# Patient Record
Sex: Female | Born: 1980 | Hispanic: No | Marital: Married | Smoking: Never smoker
Health system: Southern US, Community
[De-identification: ages and names within clinical notes are randomized; demographics above are authoritative.]

## PROBLEM LIST (undated history)

## (undated) DIAGNOSIS — T7840XA Allergy, unspecified, initial encounter: Secondary | ICD-10-CM

## (undated) DIAGNOSIS — Z789 Other specified health status: Secondary | ICD-10-CM

## (undated) DIAGNOSIS — Z3490 Encounter for supervision of normal pregnancy, unspecified, unspecified trimester: Secondary | ICD-10-CM

## (undated) DIAGNOSIS — G919 Hydrocephalus, unspecified: Secondary | ICD-10-CM

## (undated) HISTORY — DX: Allergy, unspecified, initial encounter: T78.40XA

## (undated) HISTORY — PX: NO PAST SURGERIES: SHX2092

## (undated) HISTORY — PX: BRAIN SURGERY: SHX531

---

## 2005-01-07 ENCOUNTER — Other Ambulatory Visit: Admission: RE | Admit: 2005-01-07 | Discharge: 2005-01-07 | Payer: Self-pay | Admitting: Obstetrics and Gynecology

## 2006-02-06 ENCOUNTER — Other Ambulatory Visit: Admission: RE | Admit: 2006-02-06 | Discharge: 2006-02-06 | Payer: Self-pay | Admitting: Obstetrics and Gynecology

## 2013-04-18 NOTE — L&D Delivery Note (Signed)
Delivery Note At 10:22 PM a viable female was delivered via Vaginal, Spontaneous Delivery (Presentation: ; Occiput Anterior).  APGAR: 8, 9; weight 5 lb 2 oz (2325 g).   Placenta status: Intact, Spontaneous Pathology.  Cord: 3 vessels with the following complications: None.  Cord pH: collected  Anesthesia: None  Episiotomy: None Lacerations: 1st degree;Vaginal Suture Repair: 3.0 vicryl Est. Blood Loss (mL): 300  Mom to postpartum.  Baby to Nursery.  Routine PP orders Breastfeeding Placenta to pathology  Abdifatah Colquhoun 11/10/2013, 10:59 PM

## 2013-04-23 LAB — OB RESULTS CONSOLE HIV ANTIBODY (ROUTINE TESTING): HIV: NONREACTIVE

## 2013-04-23 LAB — OB RESULTS CONSOLE HEPATITIS B SURFACE ANTIGEN: HEP B S AG: NEGATIVE

## 2013-04-23 LAB — OB RESULTS CONSOLE ANTIBODY SCREEN: ANTIBODY SCREEN: NEGATIVE

## 2013-04-23 LAB — OB RESULTS CONSOLE GC/CHLAMYDIA
Chlamydia: NEGATIVE
Gonorrhea: NEGATIVE

## 2013-04-23 LAB — OB RESULTS CONSOLE ABO/RH: RH Type: POSITIVE

## 2013-04-23 LAB — OB RESULTS CONSOLE RUBELLA ANTIBODY, IGM: Rubella: IMMUNE

## 2013-05-27 ENCOUNTER — Emergency Department (HOSPITAL_BASED_OUTPATIENT_CLINIC_OR_DEPARTMENT_OTHER): Payer: Medicaid Other

## 2013-05-27 ENCOUNTER — Emergency Department (HOSPITAL_BASED_OUTPATIENT_CLINIC_OR_DEPARTMENT_OTHER)
Admission: EM | Admit: 2013-05-27 | Discharge: 2013-05-27 | Disposition: A | Discharge status: Home / Self Care | Payer: Medicaid Other | Attending: Emergency Medicine | Admitting: Emergency Medicine

## 2013-05-27 ENCOUNTER — Encounter (HOSPITAL_BASED_OUTPATIENT_CLINIC_OR_DEPARTMENT_OTHER): Payer: Self-pay | Admitting: Emergency Medicine

## 2013-05-27 DIAGNOSIS — M549 Dorsalgia, unspecified: Secondary | ICD-10-CM | POA: Insufficient documentation

## 2013-05-27 DIAGNOSIS — N9489 Other specified conditions associated with female genital organs and menstrual cycle: Secondary | ICD-10-CM | POA: Insufficient documentation

## 2013-05-27 DIAGNOSIS — R109 Unspecified abdominal pain: Secondary | ICD-10-CM | POA: Insufficient documentation

## 2013-05-27 DIAGNOSIS — Z3490 Encounter for supervision of normal pregnancy, unspecified, unspecified trimester: Secondary | ICD-10-CM

## 2013-05-27 DIAGNOSIS — O9989 Other specified diseases and conditions complicating pregnancy, childbirth and the puerperium: Secondary | ICD-10-CM | POA: Insufficient documentation

## 2013-05-27 HISTORY — DX: Encounter for supervision of normal pregnancy, unspecified, unspecified trimester: Z34.90

## 2013-05-27 LAB — WET PREP, GENITAL
Trich, Wet Prep: NONE SEEN
Yeast Wet Prep HPF POC: NONE SEEN

## 2013-05-27 LAB — URINALYSIS, ROUTINE W REFLEX MICROSCOPIC
Bilirubin Urine: NEGATIVE
GLUCOSE, UA: NEGATIVE mg/dL
Hgb urine dipstick: NEGATIVE
Ketones, ur: NEGATIVE mg/dL
LEUKOCYTES UA: NEGATIVE
NITRITE: NEGATIVE
PROTEIN: NEGATIVE mg/dL
Specific Gravity, Urine: 1.005 (ref 1.005–1.030)
Urobilinogen, UA: 0.2 mg/dL (ref 0.0–1.0)
pH: 7.5 (ref 5.0–8.0)

## 2013-05-27 MED ORDER — SODIUM CHLORIDE 0.9 % IV BOLUS (SEPSIS)
1000.0000 mL | Freq: Once | INTRAVENOUS | Status: AC
  Administered 2013-05-27: 1000 mL via INTRAVENOUS
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
## 2013-05-27 NOTE — Discharge Instructions (Signed)
Abdominal Pain During Pregnancy °Abdominal pain is common in pregnancy. Most of the time, it does not cause harm. There are many causes of abdominal pain. Some causes are more serious than others. Some of the causes of abdominal pain in pregnancy are easily diagnosed. Occasionally, the diagnosis takes time to understand. Other times, the cause is not determined. Abdominal pain can be a sign that something is very wrong with the pregnancy, or the pain may have nothing to do with the pregnancy at all. For this reason, always tell your health care provider if you have any abdominal discomfort. °HOME CARE INSTRUCTIONS  °Monitor your abdominal pain for any changes. The following actions may help to alleviate any discomfort you are experiencing: °· Do not have sexual intercourse or put anything in your vagina until your symptoms go away completely. °· Get plenty of rest until your pain improves. °· Drink clear fluids if you feel nauseous. Avoid solid food as long as you are uncomfortable or nauseous. °· Only take over-the-counter or prescription medicine as directed by your health care provider. °· Keep all follow-up appointments with your health care provider. °SEEK IMMEDIATE MEDICAL CARE IF: °· You are bleeding, leaking fluid, or passing tissue from the vagina. °· You have increasing pain or cramping. °· You have persistent vomiting. °· You have painful or bloody urination. °· You have a fever. °· You notice a decrease in your baby's movements. °· You have extreme weakness or feel faint. °· You have shortness of breath, with or without abdominal pain. °· You develop a severe headache with abdominal pain. °· You have abnormal vaginal discharge with abdominal pain. °· You have persistent diarrhea. °· You have abdominal pain that continues even after rest, or gets worse. °MAKE SURE YOU:  °· Understand these instructions. °· Will watch your condition. °· Will get help right away if you are not doing well or get  worse. °Document Released: 04/04/2005 Document Revised: 01/23/2013 Document Reviewed: 11/01/2012 °[employer]® Patient Information ©2014 [employer]. ° °
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
## 2013-05-27 NOTE — ED Notes (Signed)
Pelvic cart to bedside 

## 2013-05-28 LAB — GC/CHLAMYDIA PROBE AMP
CT PROBE, AMP APTIMA: NEGATIVE
GC Probe RNA: NEGATIVE

## 2013-11-10 ENCOUNTER — Inpatient Hospital Stay (HOSPITAL_COMMUNITY)
Admission: AD | Admit: 2013-11-10 | Discharge: 2013-11-12 | DRG: 775 | Disposition: A | Discharge status: Home / Self Care | Payer: Medicaid Other | Source: Ambulatory Visit | Attending: Obstetrics & Gynecology | Admitting: Obstetrics & Gynecology

## 2013-11-10 ENCOUNTER — Encounter (HOSPITAL_COMMUNITY): Payer: Self-pay | Admitting: *Deleted

## 2013-11-10 DIAGNOSIS — O99892 Other specified diseases and conditions complicating childbirth: Secondary | ICD-10-CM | POA: Diagnosis present

## 2013-11-10 DIAGNOSIS — O9989 Other specified diseases and conditions complicating pregnancy, childbirth and the puerperium: Secondary | ICD-10-CM

## 2013-11-10 DIAGNOSIS — O42019 Preterm premature rupture of membranes, onset of labor within 24 hours of rupture, unspecified trimester: Secondary | ICD-10-CM | POA: Diagnosis present

## 2013-11-10 DIAGNOSIS — O923 Agalactia: Secondary | ICD-10-CM | POA: Diagnosis present

## 2013-11-10 DIAGNOSIS — O4290 Premature rupture of membranes, unspecified as to length of time between rupture and onset of labor, unspecified weeks of gestation: Principal | ICD-10-CM | POA: Diagnosis present

## 2013-11-10 DIAGNOSIS — Z22330 Carrier of Group B streptococcus: Secondary | ICD-10-CM

## 2013-11-10 HISTORY — DX: Other specified health status: Z78.9

## 2013-11-10 LAB — CBC
HEMATOCRIT: 34.6 % — AB (ref 36.0–46.0)
HEMOGLOBIN: 11.4 g/dL — AB (ref 12.0–15.0)
MCH: 28.3 pg (ref 26.0–34.0)
MCHC: 32.9 g/dL (ref 30.0–36.0)
MCV: 85.9 fL (ref 78.0–100.0)
Platelets: 171 10*3/uL (ref 150–400)
RBC: 4.03 MIL/uL (ref 3.87–5.11)
RDW: 12.8 % (ref 11.5–15.5)
WBC: 6.6 10*3/uL (ref 4.0–10.5)

## 2013-11-10 MED ORDER — NALBUPHINE HCL 10 MG/ML IJ SOLN: 10.0000 mg | INTRAMUSCULAR | Status: DC | PRN

## 2013-11-10 MED ORDER — CITRIC ACID-SODIUM CITRATE 334-500 MG/5ML PO SOLN: 30.0000 mL | ORAL | Status: DC | PRN

## 2013-11-10 MED ORDER — LACTATED RINGERS IV SOLN: INTRAVENOUS | Status: DC

## 2013-11-10 MED ORDER — DEXTROSE 5 % IV SOLN
5.0000 10*6.[IU] | Freq: Once | INTRAVENOUS | Status: AC
  Administered 2013-11-10: 5 10*6.[IU] via INTRAVENOUS
  Filled 2013-11-10: qty 5

## 2013-11-10 MED ORDER — OXYCODONE-ACETAMINOPHEN 5-325 MG PO TABS: 1.0000 | ORAL_TABLET | ORAL | Status: DC | PRN

## 2013-11-10 MED ORDER — IBUPROFEN 600 MG PO TABS
600.0000 mg | ORAL_TABLET | Freq: Four times a day (QID) | ORAL | Status: DC | PRN
  Filled 2013-11-10: qty 1

## 2013-11-10 MED ORDER — LIDOCAINE HCL (PF) 1 % IJ SOLN
30.0000 mL | INTRAMUSCULAR | Status: DC | PRN
  Administered 2013-11-10: 30 mL via SUBCUTANEOUS
  Filled 2013-11-10: qty 30

## 2013-11-10 MED ORDER — LACTATED RINGERS IV SOLN
INTRAVENOUS | Status: DC
  Administered 2013-11-10: 22:00:00 via INTRAUTERINE

## 2013-11-10 MED ORDER — PENICILLIN G POTASSIUM 5000000 UNITS IJ SOLR
2.5000 10*6.[IU] | INTRAMUSCULAR | Status: DC
  Administered 2013-11-10: 2.5 10*6.[IU] via INTRAVENOUS
  Filled 2013-11-10 (×2): qty 2.5

## 2013-11-10 MED ORDER — LACTATED RINGERS IV SOLN: 500.0000 mL | INTRAVENOUS | Status: DC | PRN

## 2013-11-10 MED ORDER — OXYTOCIN BOLUS FROM INFUSION
500.0000 mL | INTRAVENOUS | Status: DC
  Administered 2013-11-10: 500 mL via INTRAVENOUS

## 2013-11-10 MED ORDER — ONDANSETRON HCL 4 MG/2ML IJ SOLN: 4.0000 mg | Freq: Four times a day (QID) | INTRAMUSCULAR | Status: DC | PRN

## 2013-11-10 MED ORDER — ACETAMINOPHEN 325 MG PO TABS
650.0000 mg | ORAL_TABLET | ORAL | Status: DC | PRN
  Administered 2013-11-10: 650 mg via ORAL
  Filled 2013-11-10: qty 2

## 2013-11-10 MED ORDER — OXYTOCIN 40 UNITS IN LACTATED RINGERS INFUSION - SIMPLE MED
62.5000 mL/h | INTRAVENOUS | Status: DC
  Filled 2013-11-10: qty 1000

## 2013-11-10 NOTE — Progress Notes (Signed)
Tamera StandsJessica Ehly CNM notified of pt's complaints and SROM, orders received

## 2013-11-10 NOTE — H&P (Signed)
Deborah MedalHumaira Hines is a 33 y.o. female, G3P2002 at 35.2 weeks, presenting for PPROM. Patient states that she has not been experiencing contractions or VB prior to incident.  Patient reports active fetus and states gush of fluid occurred around 1700.  Contractions noted on toco and vertex position confirmed by ultrasound.    There are no active problems to display for this patient.   History of present pregnancy: Patient entered care at 12.4 weeks.   EDC of 12/13/2013 was established by Definite LMP of 03/08/2013 & 6wk US on 04/19/2013.   Anatomy scan:  18.4 weeks, with normal findings and an posterior placenta.   Additional US evaluations:  None.   Significant prenatal events:  Common pregnancy discomforts including sciatica pain, vaginal pressure, and braxton hicks contractions   Last evaluation:  11/06/2013 at 34.5wks by Dr. Carmela HurtE. Kulwa  OB History   Grav Para Term Preterm Abortions TAB SAB Ect Mult Living   3 2        2      Past Medical History  Diagnosis Date  . Pregnant    No past surgical history on file. Family History: family history is not on file. Social History:  reports that she has never smoked. She does not have any smokeless tobacco history on file. She reports that she does not drink alcohol. Her drug history is not on file.   Prenatal Transfer Tool  Maternal Diabetes: No Genetic Screening: Declined Maternal Ultrasounds/Referrals: Normal Fetal Ultrasounds or other Referrals:  None Maternal Substance Abuse:  No Significant Maternal Medications:  None Significant Maternal Lab Results: Lab values include: Other: GBS Unknown    ROS:  See HPI  No Known Allergies     Last menstrual period 03/08/2013.  Chest clear Heart RRR without murmur Abd gravid, NT Pelvic: Ft/50/-4 Ext: WNL  FHR: 130 bpm, Mod Var, -Decels, +Accels UCs:  Graphed Q 4-6 mins, palpates moderate  Prenatal labs: ABO, Rh:  B Positive Antibody:  Negative Rubella:   Immune RPR:   Non  Reactive HBsAg:   Negative HIV:   Negative GBS:  Unknown Sickle cell/Hgb electrophoresis:  Normal Pap:  Normal 06/04/13 GC:  Negative Chlamydia:  Negative Genetic screenings:  Declined Glucola:  Normal 3hr Other:  None    Assessment IUP at 35.2wks PPROM Cat I FT GBS Unknown  [facility]: [facility] consult with Dr. Katharine LookJ. Ozan Routine Labor and Delivery Orders Start PCN for GBS prophylaxis NICU consult to address patient and husbands concerns regarding preterm infant Okay for Epidural, when patient requests Anticipate SVD  Phillips ClimesMLY, Malita Ignasiak LYNNCNM, MSN 11/10/2013, 5:49 PM

## 2013-11-10 NOTE — MAU Note (Cosign Needed)
Pt presents to MAU with complaints for ROM at 1700 today clear fluid. Denies any vaginal bleeding

## 2013-11-11 LAB — CBC
HEMATOCRIT: 33.6 % — AB (ref 36.0–46.0)
Hemoglobin: 11.1 g/dL — ABNORMAL LOW (ref 12.0–15.0)
MCH: 28 pg (ref 26.0–34.0)
MCHC: 33 g/dL (ref 30.0–36.0)
MCV: 84.8 fL (ref 78.0–100.0)
Platelets: 157 10*3/uL (ref 150–400)
RBC: 3.96 MIL/uL (ref 3.87–5.11)
RDW: 12.7 % (ref 11.5–15.5)
WBC: 9.2 10*3/uL (ref 4.0–10.5)

## 2013-11-11 LAB — RPR

## 2013-11-11 MED ORDER — ONDANSETRON HCL 4 MG/2ML IJ SOLN: 4.0000 mg | INTRAMUSCULAR | Status: DC | PRN

## 2013-11-11 MED ORDER — ACETAMINOPHEN 325 MG PO TABS
650.0000 mg | ORAL_TABLET | ORAL | Status: DC | PRN
  Administered 2013-11-11 – 2013-11-12 (×4): 650 mg via ORAL
  Filled 2013-11-11 (×4): qty 2

## 2013-11-11 MED ORDER — ZOLPIDEM TARTRATE 5 MG PO TABS: 5.0000 mg | ORAL_TABLET | Freq: Every evening | ORAL | Status: DC | PRN

## 2013-11-11 MED ORDER — SIMETHICONE 80 MG PO CHEW: 80.0000 mg | CHEWABLE_TABLET | ORAL | Status: DC | PRN

## 2013-11-11 MED ORDER — WITCH HAZEL-GLYCERIN EX PADS: 1.0000 "application " | MEDICATED_PAD | CUTANEOUS | Status: DC | PRN

## 2013-11-11 MED ORDER — BENZOCAINE-MENTHOL 20-0.5 % EX AERO
1.0000 "application " | INHALATION_SPRAY | CUTANEOUS | Status: DC | PRN
  Administered 2013-11-12: 1 via TOPICAL
  Filled 2013-11-11 (×2): qty 56

## 2013-11-11 MED ORDER — SENNOSIDES-DOCUSATE SODIUM 8.6-50 MG PO TABS: 2.0000 | ORAL_TABLET | ORAL | Status: DC

## 2013-11-11 MED ORDER — DIBUCAINE 1 % RE OINT: 1.0000 "application " | TOPICAL_OINTMENT | RECTAL | Status: DC | PRN

## 2013-11-11 MED ORDER — LANOLIN HYDROUS EX OINT: TOPICAL_OINTMENT | CUTANEOUS | Status: DC | PRN

## 2013-11-11 MED ORDER — DIPHENHYDRAMINE HCL 25 MG PO CAPS: 25.0000 mg | ORAL_CAPSULE | Freq: Four times a day (QID) | ORAL | Status: DC | PRN

## 2013-11-11 MED ORDER — PRENATAL MULTIVITAMIN CH
1.0000 | ORAL_TABLET | Freq: Every day | ORAL | Status: DC
  Administered 2013-11-11: 1 via ORAL
  Filled 2013-11-11: qty 1

## 2013-11-11 MED ORDER — OXYCODONE-ACETAMINOPHEN 10-325 MG PO TABS: 1.0000 | ORAL_TABLET | ORAL | Status: DC | PRN

## 2013-11-11 MED ORDER — TETANUS-DIPHTH-ACELL PERTUSSIS 5-2.5-18.5 LF-MCG/0.5 IM SUSP: 0.5000 mL | Freq: Once | INTRAMUSCULAR | Status: DC

## 2013-11-11 MED ORDER — ONDANSETRON HCL 4 MG PO TABS: 4.0000 mg | ORAL_TABLET | ORAL | Status: DC | PRN

## 2013-11-11 NOTE — Lactation Note (Signed)
This note was copied from the chart of Deborah Nattie Mcpeters. Lactation Consultation Note  Patient Name: Deborah Hines ZOXWR'UToday's Date: 11/11/2013 Reason for consult: Other (Comment);NICU baby;Infant < 6lbs;Late preterm infant (mom asleep and has not yet received initial assessment) RN, ChyrArn Medall CivatteJoAnn [facility] is asleep right now but has initiated use of DEBP since her baby was transferred to NICU.  Previous LC had attempted a visit earlier and [facility] brochure at bedside but this mom [facility] as well as NICU booklet for breastfeeding moms.     Maternal Data Formula Feeding for Exclusion: Yes Reason for exclusion: Admission to Intensive Care Unit (ICU) post-partum (baby transferred to NICU this evening and mom pumping) Infant to breast within first hour of birth: Yes (unable to latch) Has patient been taught Hand Expression?: No Does the patient have breastfeeding experience prior to this delivery?: Yes  Feeding Feeding Type: Bottle Fed - Formula Nipple Type: Slow - flow Length of feed: 10 min  LATCH Score/Interventions            N/A - baby transferred to NICU          Lactation Tools Discussed/Used Initiated by:: RN, Chyrl CivatteJoAnn [facility] ([facility] asleep and LC deferred visit until tomorrow) Date initiated:: 11/11/13 DEBP use started by RN  Consult Status Consult Status: Follow-up Date: 11/12/13 Follow-up type: In-patient    [name], Haim Hansson [facility] 11/11/2013, 10:49 PM

## 2013-11-11 NOTE — Progress Notes (Signed)
UR chart review completed.  

## 2013-11-11 NOTE — Discharge Instructions (Signed)
Postpartum Depression and Baby Blues °The postpartum period begins right after the birth of a baby. During this time, there is often a great amount of joy and excitement. It is also a time of many changes in the life of the parents. Regardless of how many times a mother gives birth, each child brings new challenges and dynamics to the family. It is not unusual to have feelings of excitement along with confusing shifts in moods, emotions, and thoughts. All mothers are at risk of developing postpartum depression or the "baby blues." These mood changes can occur right after giving birth, or they may occur many months after giving birth. The baby blues or postpartum depression can be mild or severe. Additionally, postpartum depression can go away rather quickly, or it can be a long-term condition.  °CAUSES °Raised hormone levels and the rapid drop in those levels are thought to be a main cause of postpartum depression and the baby blues. A number of hormones change during and after pregnancy. Estrogen and progesterone usually decrease right after the delivery of your baby. The levels of thyroid hormone and various cortisol steroids also rapidly drop. Other factors that play a role in these mood changes include major life events and genetics.  °RISK FACTORS °If you have any of the following risks for the baby blues or postpartum depression, know what symptoms to watch out for during the postpartum period. Risk factors that may increase the likelihood of getting the baby blues or postpartum depression include: °· Having a personal or family history of depression.   °· Having depression while being pregnant.   °· Having premenstrual mood issues or mood issues related to oral contraceptives. °· Having a lot of life stress.   °· Having marital conflict.   °· Lacking a social support network.   °· Having a baby with special needs.   °· Having health problems, such as diabetes.   °SIGNS AND SYMPTOMS °Symptoms of baby blues  include: °· Brief changes in mood, such as going from extreme happiness to sadness. °· Decreased concentration.   °· Difficulty sleeping.   °· Crying spells, tearfulness.   °· Irritability.   °· Anxiety.   °Symptoms of postpartum depression typically begin within the first month after giving birth. These symptoms include: °· Difficulty sleeping or excessive sleepiness.   °· Marked weight loss.   °· Agitation.   °· Feelings of worthlessness.   °· Lack of interest in activity or food.   °Postpartum psychosis is a very serious condition and can be dangerous. Fortunately, it is rare. Displaying any of the following symptoms is cause for immediate medical attention. Symptoms of postpartum psychosis include:  °· Hallucinations and delusions.   °· Bizarre or disorganized behavior.   °· Confusion or disorientation.   °DIAGNOSIS  °A diagnosis is made by an evaluation of your symptoms. There are no medical or lab tests that lead to a diagnosis, but there are various questionnaires that a health care provider may use to identify those with the baby blues, postpartum depression, or psychosis. Often, a screening tool called the Edinburgh Postnatal Depression Scale is used to diagnose depression in the postpartum period.  °TREATMENT °The baby blues usually goes away on its own in 1-2 weeks. Social support is often all that is needed. You will be encouraged to get adequate sleep and rest. Occasionally, you may be given medicines to help you sleep.  °Postpartum depression requires treatment because it can last several months or longer if it is not treated. Treatment may include individual or group therapy, medicine, or both to address any social, physiological, and psychological   factors that may play a role in the depression. Regular exercise, a healthy diet, rest, and social support may also be strongly recommended.  Postpartum psychosis is more serious and needs treatment right away. Hospitalization is often needed. HOME CARE  INSTRUCTIONS  Get as much rest as you can. Nap when the baby sleeps.   Exercise regularly. Some women find yoga and walking to be beneficial.   Eat a balanced and nourishing diet.   Do little things that you enjoy. Have a cup of tea, take a bubble bath, read your favorite magazine, or listen to your favorite music.  Avoid alcohol.   Ask for help with household chores, cooking, grocery shopping, or running errands as needed. Do not try to do everything.   Talk to people close to you about how you are feeling. Get support from your partner, family members, friends, or other new moms.  Try to stay positive in how you think. Think about the things you are grateful for.   Do not spend a lot of time alone.   Only take over-the-counter or prescription medicine as directed by your health care provider.  Keep all your postpartum appointments.   Let your health care provider know if you have any concerns.  SEEK MEDICAL CARE IF: You are having a reaction to or problems with your medicine. SEEK IMMEDIATE MEDICAL CARE IF:  You have suicidal feelings.   You think you may harm the baby or someone else. MAKE SURE YOU:  Understand these instructions.  Will watch your condition.  Will get help right away if you are not doing well or get worse. Document Released: 01/07/2004 Document Revised: 04/09/2013 Document Reviewed: 01/14/2013 [facility] Patient Information 2015 [place], [place]. This information is not intended to replace advice given to you by your health care provider. Make sure you discuss any questions you have with your health care provider. Breastfeeding Deciding to breastfeed is one of the best choices you can make for you and your baby. A change in hormones during pregnancy causes your breast tissue to grow and increases the number and size of your milk ducts. These hormones also allow proteins, sugars, and fats from your blood supply to make breast milk in your  milk-producing glands. Hormones prevent breast milk from being released before your baby is born as well as prompt milk flow after birth. Once breastfeeding has begun, thoughts of your baby, as well as his or her sucking or crying, can stimulate the release of milk from your milk-producing glands.  BENEFITS OF BREASTFEEDING For Your Baby  Your first milk (colostrum) helps your baby's digestive system function better.   There are antibodies in your milk that help your baby fight off infections.   Your baby has a lower incidence of asthma, allergies, and sudden infant death syndrome.   The nutrients in breast milk are better for your baby than infant formulas and are designed uniquely for your baby's needs.   Breast milk improves your baby's brain development.   Your baby is less likely to develop other conditions, such as childhood obesity, asthma, or type 2 diabetes mellitus.  For You   Breastfeeding helps to create a very special bond between you and your baby.   Breastfeeding is convenient. Breast milk is always available at the correct temperature and costs nothing.   Breastfeeding helps to burn calories and helps you lose the weight gained during pregnancy.   Breastfeeding makes your uterus contract to its prepregnancy size faster and slows bleeding (  lochia) after you give birth.   Breastfeeding helps to lower your risk of developing type 2 diabetes mellitus, osteoporosis, and breast or ovarian cancer later in life. SIGNS THAT YOUR BABY IS HUNGRY Early Signs of Hunger  Increased alertness or activity.  Stretching.  Movement of the head from side to side.  Movement of the head and opening of the mouth when the corner of the mouth or cheek is stroked (rooting).  Increased sucking sounds, smacking lips, cooing, sighing, or squeaking.  Hand-to-mouth movements.  Increased sucking of fingers or hands. Late Signs of Hunger  Fussing.  Intermittent crying. Extreme  Signs of Hunger Signs of extreme hunger will require calming and consoling before your baby will be able to breastfeed successfully. Do not wait for the following signs of extreme hunger to occur before you initiate breastfeeding:   Restlessness.  A loud, strong cry.   Screaming. BREASTFEEDING BASICS Breastfeeding Initiation  Find a comfortable place to sit or lie down, with your neck and back well supported.  Place a pillow or rolled up blanket under your baby to bring him or her to the level of your breast (if you are seated). Nursing pillows are specially designed to help support your arms and your baby while you breastfeed.  Make sure that your baby's abdomen is facing your abdomen.   Gently massage your breast. With your fingertips, massage from your chest wall toward your nipple in a circular motion. This encourages milk flow. You may need to continue this action during the feeding if your milk flows slowly.  Support your breast with 4 fingers underneath and your thumb above your nipple. Make sure your fingers are well away from your nipple and your baby's mouth.   Stroke your baby's lips gently with your finger or nipple.   When your baby's mouth is open wide enough, quickly bring your baby to your breast, placing your entire nipple and as much of the colored area around your nipple (areola) as possible into your baby's mouth.   More areola should be visible above your baby's upper lip than below the lower lip.   Your baby's tongue should be between his or her lower gum and your breast.   Ensure that your baby's mouth is correctly positioned around your nipple (latched). Your baby's lips should create a seal on your breast and be turned out (everted).  It is common for your baby to suck about 2-3 minutes in order to start the flow of breast milk. Latching Teaching your baby how to latch on to your breast properly is very important. An improper latch can cause nipple  pain and decreased milk supply for you and poor weight gain in your baby. Also, if your baby is not latched onto your nipple properly, he or she may swallow some air during feeding. This can make your baby fussy. Burping your baby when you switch breasts during the feeding can help to get rid of the air. However, teaching your baby to latch on properly is still the best way to prevent fussiness from swallowing air while breastfeeding. Signs that your baby has successfully latched on to your nipple:    Silent tugging or silent sucking, without causing you pain.   Swallowing heard between every 3-4 sucks.    Muscle movement above and in front of his or her ears while sucking.  Signs that your baby has not successfully latched on to nipple:   Sucking sounds or smacking sounds from your baby while  breastfeeding.  Nipple pain. If you think your baby has not latched on correctly, slip your finger into the corner of your baby's mouth to break the suction and place it between your baby's gums. Attempt breastfeeding initiation again. Signs of Successful Breastfeeding Signs from your baby:   A gradual decrease in the number of sucks or complete cessation of sucking.   Falling asleep.   Relaxation of his or her body.   Retention of a small amount of milk in his or her mouth.   Letting go of your breast by himself or herself. Signs from you:  Breasts that have increased in firmness, weight, and size 1-3 hours after feeding.   Breasts that are softer immediately after breastfeeding.  Increased milk volume, as well as a change in milk consistency and color by the fifth day of breastfeeding.   Nipples that are not sore, cracked, or bleeding. Signs That Your [employer] is Getting Enough Milk  Wetting at least 3 diapers in a 24-hour period. The urine should be clear and pale yellow by age 69 days.  At least 3 stools in a 24-hour period by age 69 days. The stool should be soft and  yellow.  At least 3 stools in a 24-hour period by age 70 days. The stool should be seedy and yellow.  No loss of weight greater than 10% of birth weight during the first 66 days of age.  Average weight gain of 4-7 ounces (113-198 g) per week after age 11 days.  Consistent daily weight gain by age 36 days, without weight loss after the age of 2 weeks. After a feeding, your baby may spit up a small amount. This is common. BREASTFEEDING FREQUENCY AND DURATION Frequent feeding will help you make more milk and can prevent sore nipples and breast engorgement. Breastfeed when you feel the need to reduce the fullness of your breasts or when your baby shows signs of hunger. This is called "breastfeeding on demand." Avoid introducing a pacifier to your baby while you are working to establish breastfeeding (the first 4-6 weeks after your baby is born). After this time you may choose to use a pacifier. Research has shown that pacifier use during the first year of a baby's life decreases the risk of sudden infant death syndrome (SIDS). Allow your baby to feed on each breast as long as he or she wants. Breastfeed until your baby is finished feeding. When your baby unlatches or falls asleep while feeding from the first breast, offer the second breast. Because newborns are often sleepy in the first few weeks of life, you may need to awaken your baby to get him or her to feed. Breastfeeding times will vary from baby to baby. However, the following rules can serve as a guide to help you ensure that your baby is properly fed:  Newborns (babies 12 weeks of age or younger) may breastfeed every 1-3 hours.  Newborns should not go longer than 3 hours during the day or 5 hours during the night without breastfeeding.  You should breastfeed your baby a minimum of 8 times in a 24-hour period until you begin to introduce solid foods to your baby at around 28 months of age. BREAST MILK PUMPING Pumping and storing breast milk  allows you to ensure that your baby is exclusively fed your breast milk, even at times when you are unable to breastfeed. This is especially important if you are going back to work while you are still breastfeeding or when  you are not able to be present during feedings. Your lactation consultant can give you guidelines on how long it is safe to store breast milk.  A breast pump is a machine that allows you to pump milk from your breast into a sterile bottle. The pumped breast milk can then be stored in a refrigerator or freezer. Some breast pumps are operated by hand, while others use electricity. Ask your lactation consultant which type will work best for you. Breast pumps can be purchased, but some hospitals and breastfeeding support groups lease breast pumps on a monthly basis. A lactation consultant can teach you how to hand express breast milk, if you prefer not to use a pump.  CARING FOR YOUR BREASTS WHILE YOU BREASTFEED Nipples can become dry, cracked, and sore while breastfeeding. The following recommendations can help keep your breasts moisturized and healthy:  Avoid using soap on your nipples.   Wear a supportive bra. Although not required, special nursing bras and tank tops are designed to allow access to your breasts for breastfeeding without taking off your entire bra or top. Avoid wearing underwire-style bras or extremely tight bras.  Air dry your nipples for 3-8minutes after each feeding.   Use only cotton bra pads to absorb leaked breast milk. Leaking of breast milk between feedings is normal.   Use lanolin on your nipples after breastfeeding. Lanolin helps to maintain your skin's normal moisture barrier. If you use pure lanolin, you do not need to wash it off before feeding your baby again. Pure lanolin is not toxic to your baby. You may also hand express a few drops of breast milk and gently massage that milk into your nipples and allow the milk to air dry. In the first few weeks  after giving birth, some women experience extremely full breasts (engorgement). Engorgement can make your breasts feel heavy, warm, and tender to the touch. Engorgement peaks within 3-5 days after you give birth. The following recommendations can help ease engorgement:  Completely empty your breasts while breastfeeding or pumping. You may want to start by applying warm, moist heat (in the shower or with warm water-soaked hand towels) just before feeding or pumping. This increases circulation and helps the milk flow. If your baby does not completely empty your breasts while breastfeeding, pump any extra milk after he or she is finished.  Wear a snug bra (nursing or regular) or tank top for 1-2 days to signal your body to slightly decrease milk production.  Apply ice packs to your breasts, unless this is too uncomfortable for you.  Make sure that your baby is latched on and positioned properly while breastfeeding. If engorgement persists after 48 hours of following these recommendations, contact your health care provider or a Science writer. OVERALL HEALTH CARE RECOMMENDATIONS WHILE BREASTFEEDING  Eat healthy foods. Alternate between meals and snacks, eating 3 of each per day. Because what you eat affects your breast milk, some of the foods may make your baby more irritable than usual. Avoid eating these foods if you are sure that they are negatively affecting your baby.  Drink milk, fruit juice, and water to satisfy your thirst (about 10 glasses a day).   Rest often, relax, and continue to take your prenatal vitamins to prevent fatigue, stress, and anemia.  Continue breast self-awareness checks.  Avoid chewing and smoking tobacco.  Avoid alcohol and drug use. Some medicines that may be harmful to your baby can pass through breast milk. It is important to ask your  health care provider before taking any medicine, including all over-the-counter and prescription medicine as well as vitamin  and herbal supplements. It is possible to become pregnant while breastfeeding. If birth control is desired, ask your health care provider about options that will be safe for your baby. SEEK MEDICAL CARE IF:   You feel like you want to stop breastfeeding or have become frustrated with breastfeeding.  You have painful breasts or nipples.  Your nipples are cracked or bleeding.  Your breasts are red, tender, or warm.  You have a swollen area on either breast.  You have a fever or chills.  You have nausea or vomiting.  You have drainage other than breast milk from your nipples.  Your breasts do not become full before feedings by the fifth day after you give birth.  You feel sad and depressed.  Your baby is too sleepy to eat well.  Your baby is having trouble sleeping.   Your baby is wetting less than 3 diapers in a 24-hour period.  Your baby has less than 3 stools in a 24-hour period.  Your baby's skin or the white part of his or her eyes becomes yellow.   Your baby is not gaining weight by 275 days of age. SEEK IMMEDIATE MEDICAL CARE IF:   Your baby is overly tired (lethargic) and does not want to wake up and feed.  Your baby develops an unexplained fever. Document Released: 04/04/2005 Document Revised: 04/09/2013 Document Reviewed: 09/26/2012 [facility] Patient Information 2015 [employer], [place]. This information is not intended to replace advice given to you by your health care provider. Make sure you discuss any questions you have with your health care provider. Vaginal Delivery During delivery, your health care provider will help you give birth to your baby. During a vaginal delivery, you will work to push the baby out of your vagina. However, before you can push your baby out, a few things need to happen. The opening of your uterus (cervix) has to soften, thin out, and open up (dilate) all the way to 10 cm. Also, your baby has to move down from the uterus into your vagina.   SIGNS OF LABOR  Your health care provider will first need to make sure you are in labor. Signs of labor include:   Passing what is called the mucous plug before labor begins. This is a small amount of blood-stained mucus.  Having regular, painful uterine contractions.   The time between contractions gets shorter.   The discomfort and pain gradually get more intense.  Contraction pains get worse when walking and do not go away when resting.   Your cervix becomes thinner (effacement) and dilates. BEFORE THE DELIVERY Once you are in labor and admitted into the hospital or care center, your health care provider may do the following:   Perform a complete physical exam.  Review any complications related to pregnancy or labor.  Check your blood pressure, pulse, temperature, and heart rate (vital signs).   Determine if, and when, the rupture of amniotic membranes occurred.  Do a vaginal exam (using a sterile glove and lubricant) to determine:   The position (presentation) of the baby. Is the baby's head presenting first (vertex) in the birth canal (vagina), or are the feet or buttocks first (breech)?   The level (station) of the baby's head within the birth canal.   The effacement and dilatation of the cervix.   An electronic fetal monitor is usually placed on your abdomen when you  first arrive. This is used to monitor your contractions and the baby's heart rate.  When the monitor is on your abdomen (external fetal monitor), it can only pick up the frequency and length of your contractions. It cannot tell the strength of your contractions.  If it becomes necessary for your health care provider to know exactly how strong your contractions are or to see exactly what the baby's heart rate is doing, an internal monitor may be inserted into your vagina and uterus. Your health care provider will discuss the benefits and risks of using an internal monitor and obtain your permission  before inserting the device.  Continuous fetal monitoring may be needed if you have an epidural, are receiving certain medicines (such as oxytocin), or have pregnancy or labor complications.  An IV access tube may be placed into a vein in your arm to deliver fluids and medicines if necessary. THREE STAGES OF LABOR AND DELIVERY Normal labor and delivery is divided into three stages. First Stage This stage starts when you begin to contract regularly and your cervix begins to efface and dilate. It ends when your cervix is completely open (fully dilated). The first stage is the longest stage of labor and can last from 3 hours to 15 hours.  Several methods are available to help with labor pain. You and your health care provider will decide which option is best for you. Options include:   Opioid medicines. These are strong pain medicines that you can get through your IV tube or as a shot into your muscle. These medicines lessen pain but do not make it go away completely.  Epidural. A medicine is given through a thin tube that is inserted in your back. The medicine numbs the lower part of your body and prevents any pain in that area.  Paracervical pain medicine. This is an injection of an anesthetic on each side of your cervix.   You may request natural childbirth, which does not involve the use of pain medicines or an epidural during labor and delivery. Instead, you will use other things, such as breathing exercises, to help cope with the pain. Second Stage The second stage of labor begins when your cervix is fully dilated at 10 cm. It continues until you push your baby down through the birth canal and the baby is born. This stage can take only minutes or several hours.  The location of your baby's head as it moves through the birth canal is reported as a number called a station. If the baby's head has not started its descent, the station is described as being at minus 3 (-3). When your baby's head  is at the zero station, it is at the middle of the birth canal and is engaged in the pelvis. The station of your baby helps indicate the progress of the second stage of labor.  When your baby is born, your health care provider may hold the baby with his or her head lowered to prevent amniotic fluid, mucus, and blood from getting into the baby's lungs. The baby's mouth and nose may be suctioned with a small bulb syringe to remove any additional fluid.  Your health care provider may then place the baby on your stomach. It is important to keep the baby from getting cold. To do this, the health care provider will dry the baby off, place the baby directly on your skin (with no blankets between you and the baby), and cover the baby with warm, dry blankets.  The umbilical cord is cut. Third Stage During the third stage of labor, your health care provider will deliver the placenta (afterbirth) and make sure your bleeding is under control. The delivery of the placenta usually takes about 5 minutes but can take up to 30 minutes. After the placenta is delivered, a medicine may be given either by IV or injection to help contract the uterus and control bleeding. If you are planning to breastfeed, you can try to do so now. After you deliver the placenta, your uterus should contract and get very firm. If your uterus does not remain firm, your health care provider will massage it. This is important because the contraction of the uterus helps cut off bleeding at the site where the placenta was attached to your uterus. If your uterus does not contract properly and stay firm, you may continue to bleed heavily. If there is a lot of bleeding, medicines may be given to contract the uterus and stop the bleeding.  Document Released: 01/12/2008 Document Revised: 08/19/2013 Document Reviewed: 09/23/2012 [facility] Patient Information 2015 [place], [place]. This information is not intended to replace advice given to you by your  health care provider. Make sure you discuss any questions you have with your health care provider.

## 2013-11-11 NOTE — Discharge Summary (Signed)
Vaginal Delivery Discharge Summary  Deborah Hines [place]  DOB:    [birth year] MRN:    [id number] CSN:    [id number]  Date of admission:                  11/10/13  Date of discharge:                   11/12/13  Procedures this admission: vaginal delivery  Date of Delivery: 11/10/13  Newborn Data:  Live born female  Birth Weight: 5 lb 2 oz (2325 g) APGAR: 8, 9  Infant in NICU d/t feeding difficulties    History of Present Illness:  Ms. Deborah Hines Hines is a 33 y.o. female, G3P0103, who presents at 5898w2d weeks gestation. The patient has [facility]. She was admitted rupture of membranes. Her pregnancy has been complicated by:  Patient Active Problem List   Diagnosis Date Noted  . NSVD (normal spontaneous vaginal delivery) 11/11/2013  . Preterm PROM with onset of labor within 24 hours of rupture 11/10/2013    Hospital course:  The patient was admitted for PPROM.   Her labor was complicated by preterm infant at 35.2 weeks. She proceeded to have a vaginal delivery of a healthy infant. Her delivery was not complicated. Her postpartum course was not complicated. She was discharged to home on postpartum day 2 doing well.  Feeding:  breast and bottle  Contraception:  diaphragm  Discharge hemoglobin:  Hemoglobin  Date Value Ref Range Status  11/11/2013 11.1* 12.0 - 15.0 g/dL Final     HCT  Date Value Ref Range Status  11/11/2013 33.6* 36.0 - 46.0 % Final    Discharge Physical Exam:   General: alert and cooperative Lochia: appropriate Uterine Fundus: firm Incision: healing well DVT Evaluation: No evidence of DVT seen on physical exam.  Intrapartum Procedures: spontaneous vaginal delivery Postpartum Procedures: none Complications-Operative and Postpartum: 1st degree perineal laceration  Discharge Diagnoses: Pre Term Pregnancy-delivered   Activity:           pelvic rest Diet:                 routine Medications: PNV, Ibuprofen and Percocet Condition:      stable     Postpartum Teaching: Nutrition, exercise, return to work or school, family visits, sexual activity, home rest, vaginal bleeding, pelvic rest, family planning, s/s of PPD, breast care peri-care and incision care  Discharge to: home     Deborah Hines, CNM, MSN 11/11/2013. 10:09 PM   Postpartum Care After Vaginal Delivery  After you deliver your newborn (postpartum period), the usual stay in the hospital is 24 72 hours. If there were problems with your labor or delivery, or if you have other medical problems, you might be in the hospital longer.  While you are in the hospital, you will receive help and instructions on how to care for yourself and your newborn during the postpartum period.  While you are in the hospital:  Be sure to tell your nurses if you have pain or discomfort, as well as where you feel the pain and what makes the pain worse.  If you had an incision made near your vagina (episiotomy) or if you had some tearing during delivery, the nurses may put ice packs on your episiotomy or tear. The ice packs may help to reduce the pain and swelling.  If you are breastfeeding, you may feel uncomfortable contractions  of your uterus for a couple of weeks. This is normal. The contractions help your uterus get back to normal size.  It is normal to have some bleeding after delivery.  For the first 1 3 days after delivery, the flow is red and the amount may be similar to a period.  It is common for the flow to start and stop.  In the first few days, you may pass some small clots. Let your nurses know if you begin to pass large clots or your flow increases.  Do not  flush blood clots down the toilet before having the nurse look at them.  During the next 3 10 days after delivery, your flow should become more watery and pink or brown-tinged in color.  Ten to fourteen days after delivery, your flow  should be a small amount of yellowish-white discharge.  The amount of your flow will decrease over the first few weeks after delivery. Your flow may stop in 6 8 weeks. Most women have had their flow stop by 12 weeks after delivery.  You should change your sanitary pads frequently.  Wash your hands thoroughly with soap and water for at least 20 seconds after changing pads, using the toilet, or before holding or feeding your newborn.  You should feel like you need to empty your bladder within the first 6 8 hours after delivery.  In case you become weak, lightheaded, or faint, call your nurse before you get out of bed for the first time and before you take a shower for the first time.  Within the first few days after delivery, your breasts may begin to feel tender and full. This is called engorgement. Breast tenderness usually goes away within 48 72 hours after engorgement occurs. You may also notice milk leaking from your breasts. If you are not breastfeeding, do not stimulate your breasts. Breast stimulation can make your breasts produce more milk.  Spending as much time as possible with your newborn is very important. During this time, you and your newborn can feel close and get to know each other. Having your newborn stay in your room (rooming in) will help to strengthen the bond with your newborn. It will give you time to get to know your newborn and become comfortable caring for your newborn.  Your hormones change after delivery. Sometimes the hormone changes can temporarily cause you to feel sad or tearful. These feelings should not last more than a few days. If these feelings last longer than that, you should talk to your caregiver.  If desired, talk to your caregiver about methods of family planning or contraception.  Talk to your caregiver about immunizations. Your caregiver may want you to have the following immunizations before leaving the hospital:  Tetanus, diphtheria, and pertussis  (Tdap) or tetanus and diphtheria (Td) immunization. It is very important that you and your family (including grandparents) or others caring for your newborn are up-to-date with the Tdap or Td immunizations. The Tdap or Td immunization can help protect your newborn from getting ill.  Rubella immunization.  Varicella (chickenpox) immunization.  Influenza immunization. You should receive this annual immunization if you did not receive the immunization during your pregnancy. Document Released: 01/30/2007 Document Revised: 12/28/2011 Document Reviewed: 11/30/2011 [facility] Patient Information 2014 [place], [place].   Postpartum Depression and Baby Blues  The postpartum period begins right after the birth of a baby. During this time, there is often a great amount of joy and excitement. It is also a time  of considerable changes in the life of the parent(s). Regardless of how many times a mother gives birth, each child brings new challenges and dynamics to the family. It is not unusual to have feelings of excitement accompanied by confusing shifts in moods, emotions, and thoughts. All mothers are at risk of developing postpartum depression or the "baby blues." These mood changes can occur right after giving birth, or they may occur many months after giving birth. The baby blues or postpartum depression can be mild or severe. Additionally, postpartum depression can resolve rather quickly, or it can be a long-term condition. CAUSES Elevated hormones and their rapid decline are thought to be a main cause of postpartum depression and the baby blues. There are a number of hormones that radically change during and after pregnancy. Estrogen and progesterone usually decrease immediately after delivering your baby. The level of thyroid hormone and various cortisol steroids also rapidly drop. Other factors that play a major role in these changes include major life events and genetics.  RISK FACTORS If you have any of  the following risks for the baby blues or postpartum depression, know what symptoms to watch out for during the postpartum period. Risk factors that may increase the likelihood of getting the baby blues or postpartum depression include:  Havinga personal or family history of depression.  Having depression while being pregnant.  Having premenstrual or oral contraceptive-associated mood issues.  Having exceptional life stress.  Having marital conflict.  Lacking a social support network.  Having a baby with special needs.  Having health problems such as diabetes. SYMPTOMS Baby blues symptoms include:  Brief fluctuations in mood, such as going from extreme happiness to sadness.  Decreased concentration.  Difficulty sleeping.  Crying spells, tearfulness.  Irritability.  Anxiety. Postpartum depression symptoms typically begin within the first month after giving birth. These symptoms include:  Difficulty sleeping or excessive sleepiness.  Marked weight loss.  Agitation.  Feelings of worthlessness.  Lack of interest in activity or food. Postpartum psychosis is a very concerning condition and can be dangerous. Fortunately, it is rare. Displaying any of the following symptoms is cause for immediate medical attention. Postpartum psychosis symptoms include:  Hallucinations and delusions.  Bizarre or disorganized behavior.  Confusion or disorientation. DIAGNOSIS  A diagnosis is made by an evaluation of your symptoms. There are no medical or lab tests that lead to a diagnosis, but there are various questionnaires that a caregiver may use to identify those with the baby blues, postpartum depression, or psychosis. Often times, a screening tool called the [place] Postnatal Depression Scale is used to diagnose depression in the postpartum period.  TREATMENT The baby blues usually goes away on its own in 1 to 2 weeks. Social support is often all that is needed. You should be  encouraged to get adequate sleep and rest. Occasionally, you may be given medicines to help you sleep.  Postpartum depression requires treatment as it can last several months or longer if it is not treated. Treatment may include individual or group therapy, medicine, or both to address any social, physiological, and psychological factors that may play a role in the depression. Regular exercise, a healthy diet, rest, and social support may also be strongly recommended.  Postpartum psychosis is more serious and needs treatment right away. Hospitalization is often needed. HOME CARE INSTRUCTIONS  Get as much rest as you can. Nap when the baby sleeps.  Exercise regularly. Some women find yoga and walking to be beneficial.  Eat a  balanced and nourishing diet.  Do little things that you enjoy. Have a cup of tea, take a bubble bath, read your favorite magazine, or listen to your favorite music.  Avoid alcohol.  Ask for help with household chores, cooking, grocery shopping, or running errands as needed. Do not try to do everything.  Talk to people close to you about how you are feeling. Get support from your partner, family members, friends, or other new moms.  Try to stay positive in how you think. Think about the things you are grateful for.  Do not spend a lot of time alone.  Only take medicine as directed by your caregiver.  Keep all your postpartum appointments.  Let your caregiver know if you have any concerns. SEEK MEDICAL CARE IF: You are having a reaction or problems with your medicine. SEEK IMMEDIATE MEDICAL CARE IF:  You have suicidal feelings.  You feel you may harm the baby or someone else. Document Released: 01/07/2004 Document Revised: 06/27/2011 Document Reviewed: 02/08/2011 [facility] Patient Information 2014 [place], [place].

## 2013-11-11 NOTE — Progress Notes (Signed)
  Subjective: Called to bedside for decels - position changes and bolus already attempted.  Pt is coping well with UCs. Discussed with pt the R&B of IUPC, amnioinfusion, and FSE.  Pt voiced understanding and was agreeable.  Objective: BP 134/92  Pulse 73  Temp(Src) 98.4 F (36.9 C) (Oral)  Resp 20  Ht 5\' 2"  (1.575 m)  Wt 148 lb (67.132 kg)  BMI 27.06 kg/m2  LMP 03/08/2013  Breastfeeding? Unknown      FHT: Category II UC:   regular, every 3-5 minutes  SVE:   4cm / 70 / -2   Spontaneous labor, progressing normally  Labor: Progressing normally  Preeclampsia: BPs WNL  Fetal Wellbeing: Category II - IUPC placed and well tolerated, amnioinfusion started, FSE placed.   Pain Control: Labor support without medications  I/D: GBS pos; PCN given; SROM at 1700; Afebrile  Anticipated MOD: NSVD  Decels continued after interventions - Dr. Charlotta Newtonzan called and she stated she was coming into the hospital.  Haroldine LawsOXLEY, Catharina Pica 07/20/2013, 7:23 AM

## 2013-11-11 NOTE — Progress Notes (Signed)
Deborah Hines    Subjective: Post Partum Day 1 Vaginal delivery, 1 degree laceration Patient up ad lib, denies syncope or dizziness. Reports consuming regular diet without issues and denies N/V No issues with urination and reports bleeding is appropriate Feeding:  Breast/bottle Contraceptive plan:  Mirena  Objective: Temp:  [97.6 F (36.4 C)-98.4 F (36.9 C)] 98.4 F (36.9 C) (07/27 0550) Pulse Rate:  [66-85] 68 (07/27 0550) Resp:  [16-20] 18 (07/27 0550) BP: (104-140)/(53-92) 107/60 mmHg (07/27 0550) Weight:  [148 lb (67.132 kg)] 148 lb (67.132 kg) (07/26 1757)  Physical Exam:  General: alert and cooperative Ext: WNL Breast: soft non tender Lungs: CTAB Abdomen:  + bowel sounds, non distended Lochia: appropriate Uterine Fundus: firm Laceration: healing well DVT Evaluation: No evidence of DVT seen on physical exam.    Recent Labs  11/10/13 1755 11/11/13 0625  HGB 11.4* 11.1*  HCT 34.6* 33.6*    Assessment S/P Vaginal Delivery-Day 1 Stable Normal Involution Breastfeeding/Bottlefeeding   Plan: Continue current care Plan for discharge tomorrow Lactation support   Maico Mulvehill, CNM, MSN 11/11/2013, 8:40 AM

## 2013-11-12 ENCOUNTER — Encounter (HOSPITAL_COMMUNITY)
Admission: RE | Admit: 2013-11-12 | Discharge: 2013-11-12 | Disposition: A | Discharge status: Home / Self Care | Payer: Medicaid Other | Source: Ambulatory Visit | Attending: Obstetrics and Gynecology | Admitting: Obstetrics and Gynecology

## 2013-11-12 ENCOUNTER — Ambulatory Visit: Payer: Self-pay

## 2013-11-12 DIAGNOSIS — O923 Agalactia: Secondary | ICD-10-CM | POA: Insufficient documentation

## 2013-11-12 NOTE — Lactation Note (Signed)
This note was copied from the chart of Deborah Jelissa Moquin. Lactation Consultation Note     Initial consult since baby in NICU, but follow up consult since baby born. Mom and baby are 39  Hours pot partum, and baby is late pre termer, now 35 4/7 weeks CGA, under 5 pounds. Mom is an experienced breast feeder, first two children were full term. I reviewed pumping and hand expression with mom, and assisted mom with latching baby incross carale. Even as small as she is, she latched well and sucked for about 15 minutes. Mom's nippeel filled her mouth, and mom's nipple was pinched after feed. The baby probably did not transfer much, since she was latched shallow, but non-nutritive   breast feeding is good for both mom and baby. Mom rented a DEP on her discharge to home today. This family will be followe in the NICU.  Patient Name: Deborah Hines WUJWJ'XToday's Date: 11/12/2013     Maternal Data    Feeding Feeding Type: Formula Nipple Type: Slow - flow Length of feed: 30 min  LATCH Score/Interventions                      Lactation Tools Discussed/Used     Consult Status      Alfred LevinsLee, [place] Ducre Anne 11/12/2013, 7:04 PM

## 2013-11-12 NOTE — Lactation Note (Signed)
This note was copied from the chart of Deborah Hines. Lactation Consultation Note  Assisted with DEBP rental and deposit collected.  Mom being discharged this evening.   Patient Name: Deborah Arn MedalHumaira Arterburn WUJWJ'XToday's Date: 11/12/2013     Maternal Data    Feeding Feeding Type: Formula Nipple Type: Slow - flow Length of feed: 30 min  LATCH Score/Interventions                      Lactation Tools Discussed/Used     Consult Status      Shoptaw, Arvella MerlesJana Lynn 11/12/2013, 7:14 PM

## 2013-11-12 NOTE — Progress Notes (Signed)
Clinical Social Work Department PSYCHOSOCIAL ASSESSMENT - MATERNAL/CHILD Sep 15, 2013  Patient:  Deborah Hines, Deborah Hines  Account Number:  [id number]  [place] Date:  2013-05-03  Ardine Eng Name:   Linda Hedges    Clinical Social Worker:  Terri Piedra, LCSW   Date/Time:  2013-09-23 03:30 PM  Date Referred:        Other referral source:   No referral-NICU admission    I:  FAMILY / [place] legal guardian:  PARENT  Guardian - Name Guardian - Age Guardian - Address  [place] Beretta [address] [place] [place], [place] [address]  Aloha Gell  same   Other household support members/support persons Name Relationship DOB   DAUGHTER 37   SON 10   Other support:   Parents report having family in the area/good support system    II  PSYCHOSOCIAL DATA Information Source:  Family Interview  Museum/gallery curator and [employer] Employment:   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  They speak English fluently.    III  STRENGTHS Strengths  Adequate Resources  Compliance with medical plan  Home prepared for Child (including basic supplies)  Other - See comment  Supportive family/friends  Understanding of illness   Strength comment:  Pediatric follow up will be at [place] Current Problem:  None   Risk Factor & Current Problem Patient Issue Family Issue Risk Factor / Current Problem Comment   N N     V  SOCIAL WORK ASSESSMENT CSW met with MOB in her first floor room/121 to introduce myself, offer support and complete assessment due to NICU admission.  MOB's two children (ages 66 and 21) were visiting, but she stated CSW could talk with her at this time. FOB came in the room soon after CSW.  They  appear to have a good understanding of baby's medical condition and need for NICU intervention.  MOB states sadness over her admission, but is thankful that she is receiving needed care.  She is hopeful that baby will not need to remain in the NICU long.  CSW encouraged family to not have expectations regarding baby's discharge so not to get discouraged or to miss this period of bonding with infant. They were receptive to this recommendation.  CSW discussed common emotions related to a NICU admission, especially surrounding MOB's discharge and encouraged family to allow themselves to be emotional.  CSW discussed signs and symptoms of PPD and asked MOB to call CSW and or her doctor if she has concerns at any time.  She agreed.  FOB was attentive and supportive throughout conversation.  They report having a good support system and everything prepared for baby at home.  CSW has no social concerns at this time. CSW explained ongoing support services offered by NICU CSW and gave contact information.      VI SOCIAL WORK PLAN Social Work Therapist, art  Psychosocial Support/Ongoing Assessment of Needs   Type of pt/family education:   PPD signs and symptoms  Ongoing support services offered by NICU CSW   If child protective services report - county:   If child protective services report - date:   Information/referral to community resources comment:   No referral  needs noted at this time.   Other social work plan:

## 2013-12-11 ENCOUNTER — Encounter (HOSPITAL_COMMUNITY)
Admission: RE | Admit: 2013-12-11 | Discharge: 2013-12-11 | Disposition: A | Discharge status: Home / Self Care | Payer: Medicaid Other | Source: Ambulatory Visit | Attending: Obstetrics and Gynecology | Admitting: Obstetrics and Gynecology

## 2013-12-11 DIAGNOSIS — O923 Agalactia: Secondary | ICD-10-CM | POA: Insufficient documentation

## 2014-02-17 ENCOUNTER — Encounter (HOSPITAL_COMMUNITY): Payer: Self-pay | Admitting: *Deleted

## 2015-07-21 DIAGNOSIS — Z30431 Encounter for routine checking of intrauterine contraceptive device: Secondary | ICD-10-CM | POA: Diagnosis not present

## 2015-07-21 DIAGNOSIS — N920 Excessive and frequent menstruation with regular cycle: Secondary | ICD-10-CM | POA: Diagnosis not present

## 2015-07-21 DIAGNOSIS — N926 Irregular menstruation, unspecified: Secondary | ICD-10-CM | POA: Diagnosis not present

## 2015-07-28 DIAGNOSIS — N920 Excessive and frequent menstruation with regular cycle: Secondary | ICD-10-CM | POA: Diagnosis not present

## 2015-07-28 DIAGNOSIS — Z30431 Encounter for routine checking of intrauterine contraceptive device: Secondary | ICD-10-CM | POA: Diagnosis not present

## 2015-07-28 DIAGNOSIS — N83202 Unspecified ovarian cyst, left side: Secondary | ICD-10-CM | POA: Diagnosis not present

## 2015-07-28 DIAGNOSIS — N926 Irregular menstruation, unspecified: Secondary | ICD-10-CM | POA: Diagnosis not present

## 2015-12-08 DIAGNOSIS — H9203 Otalgia, bilateral: Secondary | ICD-10-CM | POA: Diagnosis not present

## 2015-12-08 DIAGNOSIS — Z9109 Other allergy status, other than to drugs and biological substances: Secondary | ICD-10-CM | POA: Diagnosis not present

## 2015-12-23 DIAGNOSIS — H04123 Dry eye syndrome of bilateral lacrimal glands: Secondary | ICD-10-CM | POA: Diagnosis not present

## 2015-12-23 DIAGNOSIS — H47323 Drusen of optic disc, bilateral: Secondary | ICD-10-CM | POA: Diagnosis not present

## 2016-01-11 DIAGNOSIS — R05 Cough: Secondary | ICD-10-CM | POA: Diagnosis not present

## 2016-01-26 DIAGNOSIS — R05 Cough: Secondary | ICD-10-CM | POA: Diagnosis not present

## 2017-01-24 DIAGNOSIS — H10413 Chronic giant papillary conjunctivitis, bilateral: Secondary | ICD-10-CM | POA: Diagnosis not present

## 2017-01-24 DIAGNOSIS — Z9889 Other specified postprocedural states: Secondary | ICD-10-CM | POA: Diagnosis not present

## 2017-01-24 DIAGNOSIS — H25013 Cortical age-related cataract, bilateral: Secondary | ICD-10-CM | POA: Diagnosis not present

## 2017-02-03 DIAGNOSIS — R1031 Right lower quadrant pain: Secondary | ICD-10-CM | POA: Diagnosis not present

## 2017-02-28 ENCOUNTER — Other Ambulatory Visit: Payer: Self-pay | Admitting: Family Medicine

## 2017-02-28 ENCOUNTER — Ambulatory Visit
Admission: RE | Admit: 2017-02-28 | Discharge: 2017-02-28 | Disposition: A | Discharge status: Home / Self Care | Payer: BLUE CROSS/BLUE SHIELD | Source: Ambulatory Visit | Attending: Family Medicine | Admitting: Family Medicine

## 2017-02-28 DIAGNOSIS — R05 Cough: Secondary | ICD-10-CM

## 2017-02-28 DIAGNOSIS — R6889 Other general symptoms and signs: Secondary | ICD-10-CM | POA: Diagnosis not present

## 2017-02-28 DIAGNOSIS — R509 Fever, unspecified: Secondary | ICD-10-CM

## 2017-02-28 DIAGNOSIS — R059 Cough, unspecified: Secondary | ICD-10-CM

## 2017-07-18 DIAGNOSIS — Z3040 Encounter for surveillance of contraceptives, unspecified: Secondary | ICD-10-CM | POA: Diagnosis not present

## 2017-07-18 DIAGNOSIS — N926 Irregular menstruation, unspecified: Secondary | ICD-10-CM | POA: Diagnosis not present

## 2017-07-18 DIAGNOSIS — Z01419 Encounter for gynecological examination (general) (routine) without abnormal findings: Secondary | ICD-10-CM | POA: Diagnosis not present

## 2017-07-18 DIAGNOSIS — Z124 Encounter for screening for malignant neoplasm of cervix: Secondary | ICD-10-CM | POA: Diagnosis not present

## 2017-07-18 DIAGNOSIS — N83202 Unspecified ovarian cyst, left side: Secondary | ICD-10-CM | POA: Diagnosis not present

## 2017-07-18 LAB — RESULTS CONSOLE HPV: CHL HPV: NEGATIVE

## 2017-07-18 LAB — HM PAP SMEAR: HM Pap smear: NEGATIVE

## 2017-08-17 DIAGNOSIS — Z23 Encounter for immunization: Secondary | ICD-10-CM | POA: Diagnosis not present

## 2017-10-18 DIAGNOSIS — S46811A Strain of other muscles, fascia and tendons at shoulder and upper arm level, right arm, initial encounter: Secondary | ICD-10-CM | POA: Diagnosis not present

## 2017-11-13 DIAGNOSIS — Z13220 Encounter for screening for lipoid disorders: Secondary | ICD-10-CM | POA: Diagnosis not present

## 2017-11-13 DIAGNOSIS — R42 Dizziness and giddiness: Secondary | ICD-10-CM | POA: Diagnosis not present

## 2017-11-13 DIAGNOSIS — Z6826 Body mass index (BMI) 26.0-26.9, adult: Secondary | ICD-10-CM | POA: Diagnosis not present

## 2017-11-13 DIAGNOSIS — E663 Overweight: Secondary | ICD-10-CM | POA: Diagnosis not present

## 2017-12-18 DIAGNOSIS — T783XXA Angioneurotic edema, initial encounter: Secondary | ICD-10-CM | POA: Diagnosis not present

## 2017-12-18 DIAGNOSIS — L03211 Cellulitis of face: Secondary | ICD-10-CM | POA: Diagnosis not present

## 2018-01-25 DIAGNOSIS — Z9889 Other specified postprocedural states: Secondary | ICD-10-CM | POA: Diagnosis not present

## 2018-01-25 DIAGNOSIS — H10413 Chronic giant papillary conjunctivitis, bilateral: Secondary | ICD-10-CM | POA: Diagnosis not present

## 2018-01-25 DIAGNOSIS — H25013 Cortical age-related cataract, bilateral: Secondary | ICD-10-CM | POA: Diagnosis not present

## 2019-01-08 DIAGNOSIS — Z9109 Other allergy status, other than to drugs and biological substances: Secondary | ICD-10-CM | POA: Diagnosis not present

## 2019-01-08 DIAGNOSIS — R05 Cough: Secondary | ICD-10-CM | POA: Diagnosis not present

## 2019-01-08 DIAGNOSIS — R0981 Nasal congestion: Secondary | ICD-10-CM | POA: Diagnosis not present

## 2019-01-08 DIAGNOSIS — R0602 Shortness of breath: Secondary | ICD-10-CM | POA: Diagnosis not present

## 2019-01-25 DIAGNOSIS — H25013 Cortical age-related cataract, bilateral: Secondary | ICD-10-CM | POA: Diagnosis not present

## 2019-01-25 DIAGNOSIS — H1045 Other chronic allergic conjunctivitis: Secondary | ICD-10-CM | POA: Diagnosis not present

## 2019-01-25 DIAGNOSIS — G43909 Migraine, unspecified, not intractable, without status migrainosus: Secondary | ICD-10-CM | POA: Diagnosis not present

## 2019-01-25 DIAGNOSIS — G43B0 Ophthalmoplegic migraine, not intractable: Secondary | ICD-10-CM | POA: Diagnosis not present

## 2019-03-08 DIAGNOSIS — Z0000 Encounter for general adult medical examination without abnormal findings: Secondary | ICD-10-CM | POA: Diagnosis not present

## 2019-03-08 DIAGNOSIS — Z13220 Encounter for screening for lipoid disorders: Secondary | ICD-10-CM | POA: Diagnosis not present

## 2019-03-08 DIAGNOSIS — R8290 Unspecified abnormal findings in urine: Secondary | ICD-10-CM | POA: Diagnosis not present

## 2019-03-28 ENCOUNTER — Other Ambulatory Visit (HOSPITAL_BASED_OUTPATIENT_CLINIC_OR_DEPARTMENT_OTHER): Payer: Self-pay | Admitting: Family Medicine

## 2019-03-28 DIAGNOSIS — R519 Headache, unspecified: Secondary | ICD-10-CM

## 2019-03-29 ENCOUNTER — Ambulatory Visit (HOSPITAL_BASED_OUTPATIENT_CLINIC_OR_DEPARTMENT_OTHER): Payer: Self-pay

## 2019-03-29 ENCOUNTER — Encounter: Payer: Self-pay | Admitting: Neurology

## 2019-05-20 ENCOUNTER — Encounter: Payer: Self-pay | Admitting: Neurology

## 2019-05-20 NOTE — Progress Notes (Signed)
Virtual Visit via Video Note The purpose of this virtual visit is to provide medical care while limiting exposure to the novel coronavirus.    Consent was obtained for video visit:  Yes.   Answered questions that patient had about telehealth interaction:  Yes.   I discussed the limitations, risks, security and privacy concerns of performing an evaluation and management service by telemedicine. I also discussed with the patient that there may be a patient responsible charge related to this service. The patient expressed understanding and agreed to proceed.  Pt location: Home Physician Location: office Name of referring provider:  Catha Gosselin, MD I connected with Abrish Fallert at patients initiation/request on 05/21/2019 at  2:50 PM EST by video enabled telemedicine application and verified that I am speaking with the correct person using two identifiers. Pt MRN:  [id number] Pt DOB:  1980/06/29 Video Participants:  Everlene Balls Abdella   History of Present Illness:  Nayra Coury is a 39 year old Urdu-speaking female who presents for headaches.  History supplemented by referring provider note.  She has a history of migraines since childhood that resolved in 6th or 7th grade.  They returned in 2019 and has become more frequent since May 2020.  They are severe pounding headaches of variable locations (either side, front), associated with nausea, vomiting, photophobia, phonophobia and facial numbness.  They typically last 4 to 5 hours and occur every 2 weeks.    Since May, she also has had positional vertigo, aggravated with head or neck movements.  If she bends over or has a valsalva maneuver, the dizziness is worse with an associated pounding headache starting from the back of the neck and travelling to front of her head.  This occurs almost daily.  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  She reports onset of blurred vision followed by a scotoma in  either eye that causes difficulty seeing.  This lasts a few minutes followed by a headache for 1 to 2 hours.  They have occurred about every 2 to 3 months.  Formal eye exam reportedly unremarkable.   Current NSAIDS:  none Current analgesics:  Percocet Current triptans:  Sumatriptan 100mg  Current ergotamine:  none Current anti-emetic:  none Current muscle relaxants:  none Current anti-anxiolytic:  none Current sleep aide:  none Current Antihypertensive medications:  none Current Antidepressant medications:  none Current Anticonvulsant medications:  none Current anti-CGRP:  none Current Vitamins/Herbal/Supplements:  none Current Antihistamines/Decongestants:  Allegra Other therapy:  none Hormone/birth control:  Mirena (she has been on it for 15 years)   Past NSAIDS:  Aleve (adverse reaction to NSAIDs) Past analgesics:  none Past abortive triptans:  Sumatriptan 100mg  Past abortive ergotamine:  none Past muscle relaxants:  none Past anti-emetic:  none Past antihypertensive medications:  none Past antidepressant medications:  none Past anticonvulsant medications:  none Past anti-CGRP:  none Past vitamins/Herbal/Supplements:  none Past antihistamines/decongestants:  none Other past therapies:  none   CBC and CMP from 03/08/2019 were unremarkable.  Past Medical History: Past Medical History:  Diagnosis Date  . Medical history non-contributory   . Pregnant     Medications: Outpatient Encounter Medications as of 05/21/2019  Medication Sig  . oxyCODONE-acetaminophen (PERCOCET) 10-325 MG per tablet Take 1 tablet by mouth every 4 (four) hours as needed for pain.  . Prenatal Vit-Fe Fumarate-FA (PRENATAL MULTIVITAMIN) TABS tablet Take 1 tablet by mouth daily at 12 noon.   No facility-administered encounter medications  on file as of 05/21/2019.    Allergies: No Known Allergies  Family History: No family history on file.  Social History: Social History   Socioeconomic  History  . Marital status: Married    Spouse name: Not on file  . Number of children: Not on file  . Years of education: Not on file  . Highest education level: Not on file  Occupational History  . Not on file  Tobacco Use  . Smoking status: Never Smoker  . Smokeless tobacco: Never Used  Substance and Sexual Activity  . Alcohol use: No  . Drug use: No  . Sexual activity: Yes  Other Topics Concern  . Not on file  Social History Narrative  . Not on file   Social Determinants of Health   Financial Resource Strain:   . Difficulty of Paying Living Expenses: Not on file  Food Insecurity:   . Worried About Programme researcher, broadcasting/film/video in the Last Year: Not on file  . Ran Out of Food in the Last Year: Not on file  Transportation Needs:   . Lack of Transportation (Medical): Not on file  . Lack of Transportation (Non-Medical): Not on file  Physical Activity:   . Days of Exercise per Week: Not on file  . Minutes of Exercise per Session: Not on file  Stress:   . Feeling of Stress : Not on file  Social Connections:   . Frequency of Communication with Friends and Family: Not on file  . Frequency of Social Gatherings with Friends and Family: Not on file  . Attends Religious Services: Not on file  . Active Member of Clubs or Organizations: Not on file  . Attends Banker Meetings: Not on file  . Marital Status: Not on file  Intimate Partner Violence:   . Fear of Current or Ex-Partner: Not on file  . Emotionally Abused: Not on file  . Physically Abused: Not on file  . Sexually Abused: Not on file    Observations/Objective:   Height 5\' 2"  (1.575 m), weight 132 lb (59.9 kg), unknown if currently breastfeeding. No acute distress.  Alert and oriented.  Speech fluent and not dysarthric.  Language intact.  Assessment and Plan:   1.  Exertional headache and dizziness 2.  Migraine with aura, without status migrainosus, not intractable 3.  Ocular migraine  As she has had increased  frequency of migraines as well as new daily headaches associated with dizziness, imaging of the brain as well as the intracranial and extracranial vessels is warranted (to evaluate for intracranial mass lesion, cerebral aneurysm or other vascular intracranial or extracranial abnormality.  1. MRI of brain and MRA of head and neck  2. For preventative management, start topiramate 25mg  at bedtime for one week, then increase to 50mg  at bedtime.  We can increase to 100mg  at bedtime in 5 weeks if needed. 3.  For abortive therapy, rizatriptan 10mg ; Zofran ODT 4mg  for nausea 4.  Limit use of pain relievers to no more than 2 days out of week to prevent risk of rebound or medication-overuse headache. 5.  Keep headache diary 6.  Exercise, hydration, caffeine cessation, sleep hygiene, monitor for and avoid triggers 7.  Follow up 4 months.  Follow Up Instructions:    -I discussed the assessment and treatment plan with the patient. The patient was provided an opportunity to ask questions and all were answered. The patient agreed with the plan and demonstrated an understanding of the instructions.   The  patient was advised to call back or seek an in-person evaluation if the symptoms worsen or if the condition fails to improve as anticipated.      Dudley Major, DO

## 2019-05-21 ENCOUNTER — Other Ambulatory Visit: Payer: Self-pay

## 2019-05-21 ENCOUNTER — Encounter: Payer: Self-pay | Admitting: Neurology

## 2019-05-21 ENCOUNTER — Telehealth (INDEPENDENT_AMBULATORY_CARE_PROVIDER_SITE_OTHER): Payer: BC Managed Care – PPO | Admitting: Neurology

## 2019-05-21 VITALS — Ht 62.0 in | Wt 132.0 lb

## 2019-05-21 DIAGNOSIS — G4484 Primary exertional headache: Secondary | ICD-10-CM

## 2019-05-21 DIAGNOSIS — G43109 Migraine with aura, not intractable, without status migrainosus: Secondary | ICD-10-CM

## 2019-05-21 MED ORDER — ONDANSETRON 4 MG PO TBDP: 4.0000 mg | ORAL_TABLET | Freq: Three times a day (TID) | ORAL | 3 refills | Status: DC | PRN

## 2019-05-21 MED ORDER — RIZATRIPTAN BENZOATE 10 MG PO TBDP: ORAL_TABLET | ORAL | 3 refills | Status: DC

## 2019-05-21 MED ORDER — TOPIRAMATE 25 MG PO TABS: ORAL_TABLET | ORAL | 0 refills | Status: DC

## 2019-05-24 ENCOUNTER — Telehealth: Payer: Self-pay | Admitting: Neurology

## 2019-05-24 NOTE — Telephone Encounter (Signed)
Pt called and advised 

## 2019-05-24 NOTE — Telephone Encounter (Signed)
Patient returned call to the office. Please Call. Thank you

## 2019-05-24 NOTE — Telephone Encounter (Signed)
No answer [phone]

## 2019-05-24 NOTE — Telephone Encounter (Signed)
The following message was left with [facility] on 05/24/19 at 12:30 PM.  Caller states she would like to speak to Dr. Moises Blood nurse, regarding she started taking topiramate 25 mg on Tuesday and she is having side effects, one being severe pain in her right eye and some blurred vision.  Please call patient.

## 2019-05-24 NOTE — Telephone Encounter (Signed)
It may be a migraine but since it started with the topiramate, she should stop for now.  She should give [place] an update on Monday and if doing well, I may likely recommend restarting topiramate.

## 2019-05-24 NOTE — Telephone Encounter (Signed)
Patient is having eye lid pain, and blurred vision is better today. Test are pending, should  patient continue Toprimate?

## 2019-05-27 DIAGNOSIS — Z3040 Encounter for surveillance of contraceptives, unspecified: Secondary | ICD-10-CM | POA: Diagnosis not present

## 2019-06-10 ENCOUNTER — Telehealth: Payer: Self-pay | Admitting: Neurology

## 2019-06-13 ENCOUNTER — Other Ambulatory Visit: Payer: Self-pay | Admitting: Neurology

## 2019-06-13 MED ORDER — DIAZEPAM 5 MG PO TABS: ORAL_TABLET | ORAL | 0 refills | Status: DC

## 2019-06-13 NOTE — Telephone Encounter (Signed)
Patient is having a MRI done and needs something to help her relax please call into the [employer]

## 2019-06-13 NOTE — Telephone Encounter (Signed)
Will send prescription for diazepam 5mg , take 30 to 40 minutes prior to MRI.  She will need a driver to and from the MRI

## 2019-06-13 NOTE — Telephone Encounter (Signed)
Pt called informed that Dr Everlena Cooper Will send prescription for diazepam 5mg , take 30 to 40 minutes prior to MRI.  She will need a driver to and from the MRI. Pt verbalized understanding

## 2019-06-15 ENCOUNTER — Ambulatory Visit
Admission: RE | Admit: 2019-06-15 | Discharge: 2019-06-15 | Disposition: A | Discharge status: Home / Self Care | Payer: BC Managed Care – PPO | Source: Ambulatory Visit | Attending: Neurology | Admitting: Neurology

## 2019-06-15 ENCOUNTER — Other Ambulatory Visit: Payer: Self-pay

## 2019-06-15 DIAGNOSIS — G43109 Migraine with aura, not intractable, without status migrainosus: Secondary | ICD-10-CM

## 2019-06-15 DIAGNOSIS — R519 Headache, unspecified: Secondary | ICD-10-CM | POA: Diagnosis not present

## 2019-06-15 DIAGNOSIS — G919 Hydrocephalus, unspecified: Secondary | ICD-10-CM | POA: Diagnosis not present

## 2019-06-15 DIAGNOSIS — G4484 Primary exertional headache: Secondary | ICD-10-CM

## 2019-06-15 MED ORDER — GADOBENATE DIMEGLUMINE 529 MG/ML IV SOLN
13.0000 mL | Freq: Once | INTRAVENOUS | Status: AC | PRN
  Administered 2019-06-15: 13 mL via INTRAVENOUS

## 2019-06-17 ENCOUNTER — Telehealth: Payer: Self-pay | Admitting: Neurology

## 2019-06-17 ENCOUNTER — Other Ambulatory Visit: Payer: Self-pay | Admitting: *Deleted

## 2019-06-17 DIAGNOSIS — Z3040 Encounter for surveillance of contraceptives, unspecified: Secondary | ICD-10-CM | POA: Diagnosis not present

## 2019-06-17 DIAGNOSIS — G919 Hydrocephalus, unspecified: Secondary | ICD-10-CM

## 2019-06-17 DIAGNOSIS — Z6824 Body mass index (BMI) 24.0-24.9, adult: Secondary | ICD-10-CM | POA: Diagnosis not present

## 2019-06-17 DIAGNOSIS — Z30432 Encounter for removal of intrauterine contraceptive device: Secondary | ICD-10-CM | POA: Diagnosis not present

## 2019-06-17 DIAGNOSIS — Z01419 Encounter for gynecological examination (general) (routine) without abnormal findings: Secondary | ICD-10-CM | POA: Diagnosis not present

## 2019-06-17 NOTE — Telephone Encounter (Signed)
I called and spoke with Deborah Hines regarding the MRI results.  It reveals tetra-ventricular hydrocephalus.  Since obstruction in the 4th ventricle may be missed on conventional MRI, we will order CSF flow study with CISS sequence through the posterior fossa.

## 2019-06-20 ENCOUNTER — Other Ambulatory Visit (HOSPITAL_COMMUNITY): Payer: Self-pay | Admitting: Neurology

## 2019-06-20 ENCOUNTER — Other Ambulatory Visit: Payer: Self-pay | Admitting: Neurology

## 2019-06-20 DIAGNOSIS — G919 Hydrocephalus, unspecified: Secondary | ICD-10-CM

## 2019-06-27 ENCOUNTER — Telehealth: Payer: Self-pay

## 2019-06-27 ENCOUNTER — Other Ambulatory Visit: Payer: Self-pay

## 2019-06-27 DIAGNOSIS — G919 Hydrocephalus, unspecified: Secondary | ICD-10-CM

## 2019-06-27 NOTE — Telephone Encounter (Signed)
Mri is being denied, needs Peer to Peer per referral coordinator. [phone].

## 2019-07-02 ENCOUNTER — Telehealth: Payer: Self-pay

## 2019-07-02 NOTE — Telephone Encounter (Signed)
The referral coordinator is going to try and get a peer to peer scheduled and let you know.

## 2019-07-02 NOTE — Telephone Encounter (Signed)
I tried calling the number previously and there was no option for a peer-to-peer.  Would you be able to call the number to set this up?  We can do it after my last patient.

## 2019-07-02 NOTE — Telephone Encounter (Signed)
Referral for MRI with CSF flow denied. Must call [phone]. For Peer to Peer per Referral coordinator.

## 2019-07-05 ENCOUNTER — Other Ambulatory Visit: Payer: Self-pay | Admitting: Neurology

## 2019-07-05 MED ORDER — ZONISAMIDE 50 MG PO CAPS: ORAL_CAPSULE | ORAL | 0 refills | Status: DC

## 2019-08-01 ENCOUNTER — Other Ambulatory Visit: Payer: Self-pay | Admitting: Neurology

## 2019-08-01 MED ORDER — MECLIZINE HCL 25 MG PO TABS: 25.0000 mg | ORAL_TABLET | Freq: Three times a day (TID) | ORAL | 0 refills | Status: DC | PRN

## 2019-08-01 MED ORDER — ONDANSETRON 8 MG PO TBDP: 8.0000 mg | ORAL_TABLET | Freq: Three times a day (TID) | ORAL | 3 refills | Status: DC | PRN

## 2019-08-05 ENCOUNTER — Other Ambulatory Visit: Payer: Self-pay | Admitting: Neurology

## 2019-08-05 ENCOUNTER — Other Ambulatory Visit: Payer: Self-pay

## 2019-08-05 MED ORDER — DIAZEPAM 5 MG PO TABS: ORAL_TABLET | ORAL | 0 refills | Status: DC

## 2019-08-05 MED ORDER — ZONISAMIDE 50 MG PO CAPS: 100.0000 mg | ORAL_CAPSULE | Freq: Every day | ORAL | 4 refills | Status: DC

## 2019-08-07 ENCOUNTER — Other Ambulatory Visit: Payer: Self-pay | Admitting: Neurology

## 2019-08-07 ENCOUNTER — Other Ambulatory Visit: Payer: Self-pay

## 2019-08-07 ENCOUNTER — Encounter (HOSPITAL_COMMUNITY): Payer: Self-pay

## 2019-08-07 ENCOUNTER — Ambulatory Visit (HOSPITAL_COMMUNITY): Admission: RE | Admit: 2019-08-07 | Payer: BC Managed Care – PPO | Source: Ambulatory Visit

## 2019-08-07 DIAGNOSIS — G919 Hydrocephalus, unspecified: Secondary | ICD-10-CM

## 2019-08-07 NOTE — Progress Notes (Signed)
Per Central Scheduling pt has to has have an a new order added for CSF Flow study.

## 2019-08-20 ENCOUNTER — Emergency Department (HOSPITAL_BASED_OUTPATIENT_CLINIC_OR_DEPARTMENT_OTHER)
Admission: EM | Admit: 2019-08-20 | Discharge: 2019-08-20 | Disposition: A | Discharge status: Home / Self Care | Payer: BC Managed Care – PPO | Attending: Emergency Medicine | Admitting: Emergency Medicine

## 2019-08-20 ENCOUNTER — Emergency Department (HOSPITAL_BASED_OUTPATIENT_CLINIC_OR_DEPARTMENT_OTHER): Payer: BC Managed Care – PPO

## 2019-08-20 ENCOUNTER — Other Ambulatory Visit: Payer: Self-pay

## 2019-08-20 ENCOUNTER — Encounter (HOSPITAL_BASED_OUTPATIENT_CLINIC_OR_DEPARTMENT_OTHER): Payer: Self-pay | Admitting: *Deleted

## 2019-08-20 DIAGNOSIS — R519 Headache, unspecified: Secondary | ICD-10-CM

## 2019-08-20 DIAGNOSIS — R112 Nausea with vomiting, unspecified: Secondary | ICD-10-CM | POA: Diagnosis not present

## 2019-08-20 DIAGNOSIS — G919 Hydrocephalus, unspecified: Secondary | ICD-10-CM | POA: Insufficient documentation

## 2019-08-20 DIAGNOSIS — R1110 Vomiting, unspecified: Secondary | ICD-10-CM | POA: Diagnosis not present

## 2019-08-20 DIAGNOSIS — Z79899 Other long term (current) drug therapy: Secondary | ICD-10-CM | POA: Diagnosis not present

## 2019-08-20 HISTORY — DX: Hydrocephalus, unspecified: G91.9

## 2019-08-20 LAB — BASIC METABOLIC PANEL
Anion gap: 12 (ref 5–15)
BUN: 9 mg/dL (ref 6–20)
CO2: 24 mmol/L (ref 22–32)
Calcium: 9 mg/dL (ref 8.9–10.3)
Chloride: 99 mmol/L (ref 98–111)
Creatinine, Ser: 0.55 mg/dL (ref 0.44–1.00)
GFR calc Af Amer: >60 mL/min (ref >60)
GFR calc non Af Amer: >60 mL/min (ref >60)
Glucose, Bld: 93 mg/dL (ref 70–99)
Potassium: 3.5 mmol/L (ref 3.5–5.1)
Sodium: 135 mmol/L (ref 135–145)

## 2019-08-20 LAB — CBC WITH DIFFERENTIAL/PLATELET
Abs Immature Granulocytes: 0.02 10*3/uL (ref 0.00–0.07)
Basophils Absolute: 0 10*3/uL (ref 0.0–0.1)
Basophils Relative: 0 %
Eosinophils Absolute: 0 10*3/uL (ref 0.0–0.5)
Eosinophils Relative: 0 %
HCT: 40.1 % (ref 36.0–46.0)
Hemoglobin: 14.1 g/dL (ref 12.0–15.0)
Immature Granulocytes: 0 %
Lymphocytes Relative: 19 %
Lymphs Abs: 1.4 10*3/uL (ref 0.7–4.0)
MCH: 31.2 pg (ref 26.0–34.0)
MCHC: 35.2 g/dL (ref 30.0–36.0)
MCV: 88.7 fL (ref 80.0–100.0)
Monocytes Absolute: 0.4 10*3/uL (ref 0.1–1.0)
Monocytes Relative: 5 %
Neutro Abs: 5.5 10*3/uL (ref 1.7–7.7)
Neutrophils Relative %: 76 %
Platelets: 232 10*3/uL (ref 150–400)
RBC: 4.52 MIL/uL (ref 3.87–5.11)
RDW: 11.5 % (ref 11.5–15.5)
WBC: 7.3 10*3/uL (ref 4.0–10.5)
nRBC: 0 % (ref 0.0–0.2)

## 2019-08-20 LAB — PREGNANCY, URINE: Preg Test, Ur: NEGATIVE

## 2019-08-20 MED ORDER — DEXAMETHASONE SODIUM PHOSPHATE 10 MG/ML IJ SOLN
10.0000 mg | Freq: Once | INTRAMUSCULAR | Status: AC
  Administered 2019-08-20: 17:00:00 10 mg via INTRAVENOUS
  Filled 2019-08-20: qty 1

## 2019-08-20 MED ORDER — PROMETHAZINE HCL 25 MG RE SUPP: 25.0000 mg | Freq: Four times a day (QID) | RECTAL | 0 refills | Status: DC | PRN

## 2019-08-20 MED ORDER — METOCLOPRAMIDE HCL 5 MG/ML IJ SOLN
10.0000 mg | Freq: Once | INTRAMUSCULAR | Status: AC
  Administered 2019-08-20: 10 mg via INTRAVENOUS
  Filled 2019-08-20: qty 2

## 2019-08-20 MED ORDER — DIPHENHYDRAMINE HCL 50 MG/ML IJ SOLN
25.0000 mg | Freq: Once | INTRAMUSCULAR | Status: AC
  Administered 2019-08-20: 25 mg via INTRAVENOUS
  Filled 2019-08-20: qty 1

## 2019-08-20 NOTE — ED Provider Notes (Signed)
[facility] Provider Note   CSN: [id number] Arrival date & time: 08/20/19  1315     History Chief Complaint  Patient presents with  . Emesis  . Headache    Deborah Hines is a 39 y.o. female.  HPI      Deborah Hines is a 39 y.o. female, with a history of hydrocephalus, presenting to the ED with recurrent headaches and nausea/vomiting for the past several weeks.  She was diagnosed with hydrocephalus in February of this year.  She is following with a neurologist.  She has plans already in place for continued assessment and management. Patient states the only thing that has changed is that it does not seem as though her Zofran is as effective. She contacted her neurologist's office today and states she was told to follow-up with her PCP or go to the ED.  Denies fever/chills, acute neck pain, neurologic deficits, dizziness, syncope, falls/trauma, diarrhea, abdominal pain, chest pain, shortness of breath, or any other complaints.   Past Medical History:  Diagnosis Date  . Hydrocephalus ([place])   . Medical history non-contributory   . Pregnant     Patient Active Problem List   Diagnosis Date Noted  . NSVD (normal spontaneous vaginal delivery) 11/11/2013    Past Surgical History:  Procedure Laterality Date  . NO PAST SURGERIES       OB History    Gravida  3   Para  3   Term      Preterm  1   AB      Living  3     SAB      TAB      Ectopic      Multiple      Live Births  1           No family history on file.  Social History   Tobacco Use  . Smoking status: Never Smoker  . Smokeless tobacco: Never Used  Substance Use Topics  . Alcohol use: No  . Drug use: No    Home Medications Prior to Admission medications   Medication Sig Start Date End Date Taking? Authorizing Provider  diazepam (VALIUM) 5 MG tablet Take 30 to 40 minutes prior to MRI 08/05/19  Yes Tomi Likens, Adam R, DO  meclizine (ANTIVERT) 25 MG tablet Take  1 tablet (25 mg total) by mouth 3 (three) times daily as needed for dizziness. 08/01/19  Yes Jaffe, Adam R, DO  ondansetron (ZOFRAN ODT) 8 MG disintegrating tablet Take 1 tablet (8 mg total) by mouth every 8 (eight) hours as needed for nausea or vomiting. 08/01/19  Yes Pieter Partridge, DO  Prenatal Vit-Fe Fumarate-FA (PRENATAL MULTIVITAMIN) TABS tablet Take 1 tablet by mouth daily at 12 noon.   Yes [provider]  rizatriptan (MAXALT-MLT) 10 MG disintegrating tablet Take 1 tablet earliest onset of migraine.  May repeat in 2 hours if needed.  Maximum 2 tablets in 24 hours 05/21/19  Yes Jaffe, Adam R, DO  fexofenadine (ALLEGRA ALLERGY) 180 MG tablet Allegra Allergy  PRN    [provider]  levonorgestrel (MIRENA, 52 MG,) 20 MCG/24HR IUD Mirena 20 mcg/24 hours (6 yrs) 52 mg intrauterine device 06/17/14   [provider]  oxyCODONE-acetaminophen (PERCOCET) 10-325 MG per tablet Take 1 tablet by mouth every 4 (four) hours as needed for pain. 11/11/13   Standard, Venus, CNM  promethazine (PHENERGAN) 25 MG suppository Place 1 suppository (25 mg total) rectally every 6 (six) hours as  needed for nausea or vomiting. 08/20/19   Judine Arciniega C, PA-C  zonisamide (ZONEGRAN) 50 MG capsule Take 2 capsules (100 mg total) by mouth daily. 08/05/19   Drema Dallas, DO    Allergies    Aleve [naproxen]  Review of Systems   Review of Systems  Constitutional: Negative for chills, diaphoresis and fever.  Eyes: Negative for visual disturbance.  Respiratory: Negative for cough and shortness of breath.   Cardiovascular: Negative for chest pain.  Gastrointestinal: Positive for nausea and vomiting. Negative for abdominal pain, blood in stool and diarrhea.  Musculoskeletal: Negative for neck pain.  Neurological: Positive for headaches. Negative for dizziness, syncope, weakness and numbness.  All other systems reviewed and are negative.   Physical Exam Updated Vital Signs BP 134/83   Pulse (!) 56   Temp  98.2 F (36.8 C) (Oral)   Resp 14   Ht 5\' 2"  (1.575 m)   Wt 59.4 kg   LMP 08/10/2019   SpO2 100%   BMI 23.96 kg/m   Physical Exam Vitals and nursing note reviewed.  Constitutional:      General: She is not in acute distress.    Appearance: She is well-developed. She is not diaphoretic.  HENT:     Head: Normocephalic and atraumatic.     Mouth/Throat:     Mouth: Mucous membranes are moist.     Pharynx: Oropharynx is clear.  Eyes:     Conjunctiva/sclera: Conjunctivae normal.  Cardiovascular:     Rate and Rhythm: Normal rate and regular rhythm.     Pulses: Normal pulses.          Radial pulses are 2+ on the right side and 2+ on the left side.       Posterior tibial pulses are 2+ on the right side and 2+ on the left side.     Heart sounds: Normal heart sounds.     Comments: Tactile temperature in the extremities appropriate and equal bilaterally. Pulmonary:     Effort: Pulmonary effort is normal. No respiratory distress.     Breath sounds: Normal breath sounds.  Abdominal:     Palpations: Abdomen is soft.     Tenderness: There is no abdominal tenderness. There is no guarding.  Musculoskeletal:     Cervical back: Normal range of motion and neck supple. No tenderness.     Right lower leg: No edema.     Left lower leg: No edema.     Comments: Normal motor function intact in all extremities. No midline spinal tenderness.   Lymphadenopathy:     Cervical: No cervical adenopathy.  Skin:    General: Skin is warm and dry.  Neurological:     Mental Status: She is alert and oriented to person, place, and time.     Comments: No noted acute cognitive deficit. Sensation grossly intact to light touch in the extremities.   Grip strengths equal bilaterally.   Strength 5/5 in all extremities.  No gait disturbance.  Coordination intact.  Cranial nerves III-XII grossly intact.  Handles oral secretions without noted difficulty.  No noted phonation or speech deficit. No facial droop.     Psychiatric:        Mood and Affect: Mood and affect normal.        Speech: Speech normal.        Behavior: Behavior normal.     ED Results / Procedures / Treatments   Labs (all labs ordered are listed, but only abnormal results are  displayed) Labs Reviewed  BASIC METABOLIC PANEL  CBC WITH DIFFERENTIAL/PLATELET  PREGNANCY, URINE    EKG None  Radiology CT Head Wo Contrast  Result Date: 08/20/2019 CLINICAL DATA:  Headache. EXAM: CT HEAD WITHOUT CONTRAST TECHNIQUE: Contiguous axial images were obtained from the base of the skull through the vertex without intravenous contrast. COMPARISON:  None. FINDINGS: Brain: There is no acute intracranial hemorrhage. Again noted is hydrocephalus which appears grossly similar to prior MRI given differences in modality. There is no midline shift. Vascular: No hyperdense vessel or unexpected calcification. Skull: Normal. Negative for fracture or focal lesion. Sinuses/Orbits: No acute finding. Other: None. IMPRESSION: 1. No acute intracranial hemorrhage. 2. Stable hydrocephalus. Electronically Signed   By: Katherine Mantle M.D.   On: 08/20/2019 17:07    Procedures Procedures (including critical care time)  Medications Ordered in ED Medications  metoCLOPramide (REGLAN) injection 10 mg (10 mg Intravenous Given 08/20/19 1610)  dexamethasone (DECADRON) injection 10 mg (10 mg Intravenous Given 08/20/19 1636)  diphenhydrAMINE (BENADRYL) injection 25 mg (25 mg Intravenous Given 08/20/19 1636)    ED Course  I have reviewed the triage vital signs and the nursing notes.  Pertinent labs & imaging results that were available during my care of the patient were reviewed by me and considered in my medical decision making (see chart for details).  Clinical Course as of Aug 21 1528  Tue Aug 20, 2019  1632 Spoke with Dr. Karel Jarvis, neurologist.  We reviewed the patient's symptoms and her presentation.  She reviewed Dr. Moises Blood notes and plan.  She states patient can  be treated in the same manner as we would a migraine.  Head CT to assure no acute abnormality.  She can call the office to discuss additional follow-up.   [SJ]  1710 States her symptoms have resolved.   [SJ]    Clinical Course User Index [SJ] Jacora Hopkins, Hillard Danker, PA-C   MDM Rules/Calculators/A&P                      Patient presents with headaches, nausea, and vomiting.  No focal neurologic deficits. Patient is nontoxic appearing, afebrile, not tachycardic, not tachypneic, not hypotensive, maintains excellent SPO2 on room air, and is in no apparent distress.   I have reviewed the patient's chart to obtain more information.   I reviewed and interpreted the patient's labs and radiological studies. No acute abnormalities on patient's labs or head CT. Symptoms resolved here in the ED with treatment. The patient was given instructions for home care as well as return precautions. Patient voices understanding of these instructions, accepts the plan, and is comfortable with discharge.   Findings and plan of care discussed with Alvira Monday, MD.   Vitals:   08/20/19 1321 08/20/19 1324 08/20/19 1531 08/20/19 1758  BP:  134/83 115/77 (!) 105/45  Pulse:  (!) 56 (!) 56 (!) 53  Resp:  14 16 18   Temp:  98.2 F (36.8 C)  98.5 F (36.9 C)  TempSrc:  Oral  Oral  SpO2:  100% 98% 99%  Weight: 59.4 kg     Height: 5\' 2"  (1.575 m)        Final Clinical Impression(s) / ED Diagnoses Final diagnoses:  Non-intractable vomiting with nausea, unspecified vomiting type  Recurrent headache    Rx / DC Orders ED Discharge Orders         Ordered    promethazine (PHENERGAN) 25 MG suppository  Every 6 hours PRN  08/20/19 1745           Anselm Pancoast, PA-C 08/22/19 1531    Alvira Monday, MD 08/28/19 [phone]

## 2019-08-20 NOTE — Discharge Instructions (Addendum)
Follow-up with your primary care provider and neurologist on this matter. Be sure to stay well-hydrated by drinking plenty of water. Be sure to eat regular meals high in nutrition. Nausea/vomiting: Use the promethazine suppository (generic for Phenergan) for nausea or vomiting, especially if use of the ondansetron (Zofran) does not work.  This medication may not prevent all vomiting or nausea, but can help facilitate better hydration. Things that can help with nausea/vomiting also include peppermint/menthol candies, vitamin B12, and ginger.

## 2019-08-20 NOTE — ED Triage Notes (Signed)
Hx of hydrocephalus. Here with headache and vomiting for a week. Her neurology appointment was cancelled and rescheduled for May 10th.

## 2019-08-25 DIAGNOSIS — R112 Nausea with vomiting, unspecified: Secondary | ICD-10-CM | POA: Diagnosis not present

## 2019-08-25 DIAGNOSIS — G43911 Migraine, unspecified, intractable, with status migrainosus: Secondary | ICD-10-CM | POA: Diagnosis not present

## 2019-08-26 ENCOUNTER — Other Ambulatory Visit: Payer: Self-pay

## 2019-08-26 ENCOUNTER — Ambulatory Visit (HOSPITAL_COMMUNITY)
Admission: RE | Admit: 2019-08-26 | Discharge: 2019-08-26 | Disposition: A | Discharge status: Home / Self Care | Payer: BC Managed Care – PPO | Source: Ambulatory Visit | Attending: Neurology | Admitting: Neurology

## 2019-08-26 DIAGNOSIS — R519 Headache, unspecified: Secondary | ICD-10-CM | POA: Diagnosis not present

## 2019-08-26 DIAGNOSIS — G919 Hydrocephalus, unspecified: Secondary | ICD-10-CM | POA: Insufficient documentation

## 2019-08-27 ENCOUNTER — Telehealth: Payer: Self-pay

## 2019-08-27 NOTE — Telephone Encounter (Signed)
Telephone call to pt, Advised pt of note.

## 2019-08-27 NOTE — Telephone Encounter (Signed)
-----   Message from Drema Dallas, DO sent at 08/27/2019  7:23 AM EDT ----- Imaging reveals nothing causing any blockage of spinal fluid within her brain.  I do not think findings on brain MRI are contributing to her headaches.  At this point, I would continue trying to get her headaches under control.  To try and improve headache frequency, we can start nortriptyline 10mg  at bedtime and reassess at her follow up next month.

## 2019-09-01 DIAGNOSIS — N3000 Acute cystitis without hematuria: Secondary | ICD-10-CM | POA: Diagnosis not present

## 2019-09-01 DIAGNOSIS — R112 Nausea with vomiting, unspecified: Secondary | ICD-10-CM | POA: Diagnosis not present

## 2019-09-03 ENCOUNTER — Other Ambulatory Visit: Payer: Self-pay

## 2019-09-03 ENCOUNTER — Encounter (HOSPITAL_BASED_OUTPATIENT_CLINIC_OR_DEPARTMENT_OTHER): Payer: Self-pay | Admitting: *Deleted

## 2019-09-03 ENCOUNTER — Emergency Department (HOSPITAL_BASED_OUTPATIENT_CLINIC_OR_DEPARTMENT_OTHER): Payer: BC Managed Care – PPO

## 2019-09-03 ENCOUNTER — Emergency Department (HOSPITAL_BASED_OUTPATIENT_CLINIC_OR_DEPARTMENT_OTHER)
Admission: EM | Admit: 2019-09-03 | Discharge: 2019-09-03 | Disposition: A | Discharge status: Home / Self Care | Payer: BC Managed Care – PPO | Attending: Emergency Medicine | Admitting: Emergency Medicine

## 2019-09-03 DIAGNOSIS — R112 Nausea with vomiting, unspecified: Secondary | ICD-10-CM

## 2019-09-03 DIAGNOSIS — Z886 Allergy status to analgesic agent status: Secondary | ICD-10-CM | POA: Insufficient documentation

## 2019-09-03 DIAGNOSIS — Z79899 Other long term (current) drug therapy: Secondary | ICD-10-CM | POA: Diagnosis not present

## 2019-09-03 DIAGNOSIS — R109 Unspecified abdominal pain: Secondary | ICD-10-CM | POA: Diagnosis not present

## 2019-09-03 DIAGNOSIS — R1110 Vomiting, unspecified: Secondary | ICD-10-CM | POA: Diagnosis not present

## 2019-09-03 LAB — CBC WITH DIFFERENTIAL/PLATELET
Abs Immature Granulocytes: 0.04 10*3/uL (ref 0.00–0.07)
Basophils Absolute: 0 10*3/uL (ref 0.0–0.1)
Basophils Relative: 0 %
Eosinophils Absolute: 0 10*3/uL (ref 0.0–0.5)
Eosinophils Relative: 0 %
HCT: 44.2 % (ref 36.0–46.0)
Hemoglobin: 15.3 g/dL — ABNORMAL HIGH (ref 12.0–15.0)
Immature Granulocytes: 0 %
Lymphocytes Relative: 11 %
Lymphs Abs: 1.1 10*3/uL (ref 0.7–4.0)
MCH: 30.8 pg (ref 26.0–34.0)
MCHC: 34.6 g/dL (ref 30.0–36.0)
MCV: 89.1 fL (ref 80.0–100.0)
Monocytes Absolute: 0.5 10*3/uL (ref 0.1–1.0)
Monocytes Relative: 5 %
Neutro Abs: 8.5 10*3/uL — ABNORMAL HIGH (ref 1.7–7.7)
Neutrophils Relative %: 84 %
Platelets: 271 10*3/uL (ref 150–400)
RBC: 4.96 MIL/uL (ref 3.87–5.11)
RDW: 11.3 % — ABNORMAL LOW (ref 11.5–15.5)
WBC: 10.2 10*3/uL (ref 4.0–10.5)
nRBC: 0 % (ref 0.0–0.2)

## 2019-09-03 LAB — COMPREHENSIVE METABOLIC PANEL
ALT: 24 U/L (ref 0–44)
AST: 17 U/L (ref 15–41)
Albumin: 4.6 g/dL (ref 3.5–5.0)
Alkaline Phosphatase: 64 U/L (ref 38–126)
Anion gap: 11 (ref 5–15)
BUN: 5 mg/dL — ABNORMAL LOW (ref 6–20)
CO2: 25 mmol/L (ref 22–32)
Calcium: 9.2 mg/dL (ref 8.9–10.3)
Chloride: 99 mmol/L (ref 98–111)
Creatinine, Ser: 0.64 mg/dL (ref 0.44–1.00)
GFR calc Af Amer: >60 mL/min (ref >60)
GFR calc non Af Amer: >60 mL/min (ref >60)
Glucose, Bld: 109 mg/dL — ABNORMAL HIGH (ref 70–99)
Potassium: 3.5 mmol/L (ref 3.5–5.1)
Sodium: 135 mmol/L (ref 135–145)
Total Bilirubin: 0.7 mg/dL (ref 0.3–1.2)
Total Protein: 7.9 g/dL (ref 6.5–8.1)

## 2019-09-03 LAB — PREGNANCY, URINE: Preg Test, Ur: NEGATIVE

## 2019-09-03 LAB — LIPASE, BLOOD: Lipase: 20 U/L (ref 11–51)

## 2019-09-03 MED ORDER — SODIUM CHLORIDE 0.9 % IV BOLUS: 1000.0000 mL | Freq: Once | INTRAVENOUS | Status: DC

## 2019-09-03 MED ORDER — IOHEXOL 300 MG/ML  SOLN
100.0000 mL | Freq: Once | INTRAMUSCULAR | Status: AC | PRN
  Administered 2019-09-03: 100 mL via INTRAVENOUS

## 2019-09-03 MED ORDER — METOCLOPRAMIDE HCL 5 MG/ML IJ SOLN
10.0000 mg | Freq: Once | INTRAMUSCULAR | Status: AC
  Administered 2019-09-03: 10 mg via INTRAVENOUS
  Filled 2019-09-03: qty 2

## 2019-09-03 NOTE — Discharge Instructions (Addendum)
Continue taking your home medications. Is important for you to follow-up with your primary care provider as they may need to refer you to a GI specialist. Return to the ED if you start to have worsening abdominal pain, chest pain, shortness of breath or vomiting up blood.

## 2019-09-03 NOTE — ED Notes (Signed)
Pt. Reports headache when she moves only in the front and back of the head.  Pt. Has had swelling in her head she reports to RN in past.

## 2019-09-03 NOTE — ED Provider Notes (Signed)
[facility] EMERGENCY DEPARTMENT Provider Note   CSN: [id number] Arrival date & time: 09/03/19  1815     History Chief Complaint  Patient presents with  . Emesis    Deborah Hines is a 39 y.o. female with a past medical history of hydrocephalus diagnosed in February 2021 presenting to the ED with a chief complaint of emesis.  For the past 6 weeks has been experiencing several episodes of nonbloody, nonbilious emesis and indigestion daily.  She has been prescribed Phenergan, Reglan but continues to have symptoms.  She has been seen several times at other ED's in urgent care and usually her symptoms improve with medications given there.  However after 1 or 2 days she begins to have emesis again.  She was prescribed an antibiotic for UTI last week but is unsure if she is able to keep this down.  She denies any changes to bowel movements, dysuria, possibility of pregnancy, vaginal discharge, abnormal vaginal bleeding, prior abdominal surgeries, history of PUD, chest pain, shortness of breath or sick contacts with similar symptoms.  HPI     Past Medical History:  Diagnosis Date  . Hydrocephalus ([facility])   . Medical history non-contributory   . Pregnant     Patient Active Problem List   Diagnosis Date Noted  . NSVD (normal spontaneous vaginal delivery) 11/11/2013    Past Surgical History:  Procedure Laterality Date  . NO PAST SURGERIES       OB History    Gravida  3   Para  3   Term      Preterm  1   AB      Living  3     SAB      TAB      Ectopic      Multiple      Live Births  1           No family history on file.  Social History   Tobacco Use  . Smoking status: Never Smoker  . Smokeless tobacco: Never Used  Substance Use Topics  . Alcohol use: No  . Drug use: No    Home Medications Prior to Admission medications   Medication Sig Start Date End Date Taking? Authorizing Provider  diazepam (VALIUM) 5 MG tablet Take 30 to 40 minutes  prior to MRI 08/05/19   Pieter Partridge, DO  fexofenadine (ALLEGRA ALLERGY) 180 MG tablet [facility]  PRN    [provider]  levonorgestrel (MIRENA, 52 MG,) 20 MCG/24HR IUD Mirena 20 mcg/24 hours (6 yrs) 52 mg intrauterine device 06/17/14   [provider]  meclizine (ANTIVERT) 25 MG tablet Take 1 tablet (25 mg total) by mouth 3 (three) times daily as needed for dizziness. 08/01/19   Tomi Likens, Adam R, DO  ondansetron (ZOFRAN ODT) 8 MG disintegrating tablet Take 1 tablet (8 mg total) by mouth every 8 (eight) hours as needed for nausea or vomiting. 08/01/19   Pieter Partridge, DO  oxyCODONE-acetaminophen (PERCOCET) 10-325 MG per tablet Take 1 tablet by mouth every 4 (four) hours as needed for pain. 11/11/13   Standard, Venus, CNM  Prenatal Vit-Fe Fumarate-FA (PRENATAL MULTIVITAMIN) TABS tablet Take 1 tablet by mouth daily at 12 noon.    [provider]  promethazine (PHENERGAN) 25 MG suppository Place 1 suppository (25 mg total) rectally every 6 (six) hours as needed for nausea or vomiting. 08/20/19   Joy, Shawn C, PA-C  rizatriptan (MAXALT-MLT) 10 MG disintegrating tablet Take 1 tablet  earliest onset of migraine.  May repeat in 2 hours if needed.  Maximum 2 tablets in 24 hours 05/21/19   Drema Dallas, DO  zonisamide (ZONEGRAN) 50 MG capsule Take 2 capsules (100 mg total) by mouth daily. 08/05/19   Drema Dallas, DO    Allergies    Aleve [naproxen]  Review of Systems   Review of Systems  Constitutional: Negative for appetite change, chills and fever.  HENT: Negative for ear pain, rhinorrhea, sneezing and sore throat.   Eyes: Negative for photophobia and visual disturbance.  Respiratory: Negative for cough, chest tightness, shortness of breath and wheezing.   Cardiovascular: Negative for chest pain and palpitations.  Gastrointestinal: Positive for abdominal pain, nausea and vomiting. Negative for blood in stool, constipation and diarrhea.  Genitourinary: Negative for dysuria,  hematuria and urgency.  Musculoskeletal: Negative for myalgias.  Skin: Negative for rash.  Neurological: Negative for dizziness, weakness and light-headedness.    Physical Exam Updated Vital Signs BP 126/75 (BP Location: Left Arm)   Pulse 64   Temp 98.5 F (36.9 C) (Oral)   Resp 18   Ht 5\' 2"  (1.575 m)   Wt 58.7 kg   LMP 08/10/2019   SpO2 100%   BMI 23.69 kg/m   Physical Exam Vitals and nursing note reviewed.  Constitutional:      General: She is not in acute distress.    Appearance: She is well-developed.  HENT:     Head: Normocephalic and atraumatic.     Nose: Nose normal.  Eyes:     General: No scleral icterus.       Right eye: No discharge.        Left eye: No discharge.     Conjunctiva/sclera: Conjunctivae normal.  Cardiovascular:     Rate and Rhythm: Normal rate and regular rhythm.     Heart sounds: Normal heart sounds. No murmur. No friction rub. No gallop.   Pulmonary:     Effort: Pulmonary effort is normal. No respiratory distress.     Breath sounds: Normal breath sounds.  Abdominal:     General: Bowel sounds are normal. There is no distension.     Palpations: Abdomen is soft.     Tenderness: There is abdominal tenderness (Generalized). There is no guarding.  Musculoskeletal:        General: Normal range of motion.     Cervical back: Normal range of motion and neck supple.  Skin:    General: Skin is warm and dry.     Findings: No rash.  Neurological:     Mental Status: She is alert.     Motor: No abnormal muscle tone.     Coordination: Coordination normal.     ED Results / Procedures / Treatments   Labs (all labs ordered are listed, but only abnormal results are displayed) Labs Reviewed  COMPREHENSIVE METABOLIC PANEL - Abnormal; Notable for the following components:      Result Value   Glucose, Bld 109 (*)    BUN 5 (*)    All other components within normal limits  CBC WITH DIFFERENTIAL/PLATELET - Abnormal; Notable for the following components:    Hemoglobin 15.3 (*)    RDW 11.3 (*)    Neutro Abs 8.5 (*)    All other components within normal limits  LIPASE, BLOOD  PREGNANCY, URINE    EKG None  Radiology CT ABDOMEN PELVIS W CONTRAST  Result Date: 09/03/2019 CLINICAL DATA:  Persistent nausea and vomiting. EXAM: CT ABDOMEN AND  PELVIS WITH CONTRAST TECHNIQUE: Multidetector CT imaging of the abdomen and pelvis was performed using the standard protocol following bolus administration of intravenous contrast. CONTRAST:  OMNIPAQUE IOHEXOL 300 MG/ML  SOLN COMPARISON:  None. FINDINGS: Lower chest: The lung bases are clear. The heart size is normal. Hepatobiliary: The liver is normal. Normal gallbladder.There is no biliary ductal dilation. Pancreas: Normal contours without ductal dilatation. No peripancreatic fluid collection. Spleen: Unremarkable. Adrenals/Urinary Tract: --Adrenal glands: Unremarkable. --Right kidney/ureter: No hydronephrosis or radiopaque kidney stones. --Left kidney/ureter: No hydronephrosis or radiopaque kidney stones. --Urinary bladder: Unremarkable. Stomach/Bowel: --Stomach/Duodenum: No hiatal hernia or other gastric abnormality. Normal duodenal course and caliber. --Small bowel: Unremarkable. --Colon: Unremarkable. --Appendix: Normal. Vascular/Lymphatic: Normal course and caliber of the major abdominal vessels. --No retroperitoneal lymphadenopathy. --No mesenteric lymphadenopathy. --No pelvic or inguinal lymphadenopathy. Reproductive: There is a 2.9 cm right ovarian cyst. No further follow-up is required. Other: No ascites or free air. The abdominal wall is normal. Musculoskeletal. No acute displaced fractures. IMPRESSION: No acute abdominopelvic abnormality. Electronically Signed   By: Katherine Mantle M.D.   On: 09/03/2019 22:36    Procedures Procedures (including critical care time)  Medications Ordered in ED Medications  metoCLOPramide (REGLAN) injection 10 mg (10 mg Intravenous Given 09/03/19 2117)  iohexol  (OMNIPAQUE) 300 MG/ML solution 100 mL (100 mLs Intravenous Contrast Given 09/03/19 2157)    ED Course  I have reviewed the triage vital signs and the nursing notes.  Pertinent labs & imaging results that were available during my care of the patient were reviewed by me and considered in my medical decision making (see chart for details).    MDM Rules/Calculators/A&P                      39 year old female with a past medical history of hydrocephalus diagnosed in February 2021 presenting to the ED with a chief complaint of emesis.  Reports symptoms have been going on for the past 6 weeks.  She reports feelings of indigestion and generalized abdominal discomfort.  Denies any changes to bowel movements or urination.  Denies any vaginal discharge or abnormal vaginal bleeding.  This is about her fourth or fifth visit for the same symptoms.  She has had improvement with Phenergan and Reglan but states that her symptoms typically recur after several days.  She is scheduled to see her PCP tomorrow.  On exam abdomen is generally tender without rebound or guarding.  She is afebrile without recent use of antipyretics.  Lab work including CMP, CBC, lipase unremarkable.  Urine pregnancy is negative.  CT abdomen pelvis without any acute findings.  Suspect possible viral etiology?  I feel that she will benefit from PCP follow-up for potential GI referral.  I do not see any emergent findings on today's work-up and she feels better with IV Reglan.  I have her follow-up with PCP and return for worsening symptoms.  All imaging, if done today, including plain films, CT scans, and ultrasounds, independently reviewed by me, and interpretations confirmed via formal radiology reads.  Patient is hemodynamically stable, in NAD, and able to ambulate in the ED. Evaluation does not show pathology that would require ongoing emergent intervention or inpatient treatment. I explained the diagnosis to the patient. Pain has been managed  and has no complaints prior to discharge. Patient is comfortable with above plan and is stable for discharge at this time. All questions were answered prior to disposition. Strict return precautions for returning to the ED were discussed.  Encouraged follow up with PCP.   An After Visit Summary was printed and given to the patient.   Portions of this note were generated with Scientist, clinical (histocompatibility and immunogenetics). Dictation errors may occur despite best attempts at proofreading.  Final Clinical Impression(s) / ED Diagnoses Final diagnoses:  Non-intractable vomiting with nausea, unspecified vomiting type    Rx / DC Orders ED Discharge Orders    None       Dietrich Pates, PA-C 09/03/19 2248    Milagros Loll, MD 09/04/19 1301

## 2019-09-03 NOTE — ED Triage Notes (Signed)
Vomiting x 6 weeks. She has been seen at numerous locations.

## 2019-09-04 DIAGNOSIS — G43919 Migraine, unspecified, intractable, without status migrainosus: Secondary | ICD-10-CM | POA: Diagnosis not present

## 2019-09-05 ENCOUNTER — Other Ambulatory Visit: Payer: Self-pay | Admitting: Neurology

## 2019-09-05 ENCOUNTER — Encounter: Payer: Self-pay | Admitting: *Deleted

## 2019-09-05 MED ORDER — AIMOVIG 70 MG/ML ~~LOC~~ SOAJ: 70.0000 mg | SUBCUTANEOUS | 11 refills | Status: DC

## 2019-09-05 NOTE — Progress Notes (Addendum)
Deborah Hines (Key: [id number])  This request has received a Favorable outcome from [facility].  Please keep in mind this is not a guarantee of payment. Eligibility and Benefit determinations will be made at the time of service.  Please note any additional information provided by [facility] at the bottom of the screen.  Deborah Hines (Key: [id number]) Rx #: [id number] Aimovig 70MG /ML auto-injectors   Form Blue Form (CB) Created 24 hours ago Sent to Plan 23 hours ago Plan Response 23 hours ago Submit Clinical Questions 23 hours ago Determination Favorable 23 hours ago

## 2019-09-07 DIAGNOSIS — R52 Pain, unspecified: Secondary | ICD-10-CM | POA: Diagnosis not present

## 2019-09-07 DIAGNOSIS — Y92230 Patient room in hospital as the place of occurrence of the external cause: Secondary | ICD-10-CM | POA: Diagnosis not present

## 2019-09-07 DIAGNOSIS — G4489 Other headache syndrome: Secondary | ICD-10-CM | POA: Diagnosis not present

## 2019-09-07 DIAGNOSIS — G919 Hydrocephalus, unspecified: Secondary | ICD-10-CM | POA: Diagnosis not present

## 2019-09-07 DIAGNOSIS — G910 Communicating hydrocephalus: Secondary | ICD-10-CM | POA: Diagnosis not present

## 2019-09-07 DIAGNOSIS — G932 Benign intracranial hypertension: Secondary | ICD-10-CM | POA: Diagnosis not present

## 2019-09-07 DIAGNOSIS — G918 Other hydrocephalus: Secondary | ICD-10-CM | POA: Diagnosis not present

## 2019-09-07 DIAGNOSIS — G8929 Other chronic pain: Secondary | ICD-10-CM | POA: Diagnosis not present

## 2019-09-07 DIAGNOSIS — G43701 Chronic migraine without aura, not intractable, with status migrainosus: Secondary | ICD-10-CM | POA: Diagnosis not present

## 2019-09-07 DIAGNOSIS — R112 Nausea with vomiting, unspecified: Secondary | ICD-10-CM | POA: Diagnosis not present

## 2019-09-07 DIAGNOSIS — H4710 Unspecified papilledema: Secondary | ICD-10-CM | POA: Diagnosis not present

## 2019-09-07 DIAGNOSIS — E872 Acidosis: Secondary | ICD-10-CM | POA: Diagnosis not present

## 2019-09-07 DIAGNOSIS — G9389 Other specified disorders of brain: Secondary | ICD-10-CM | POA: Diagnosis not present

## 2019-09-07 DIAGNOSIS — R519 Headache, unspecified: Secondary | ICD-10-CM | POA: Diagnosis not present

## 2019-09-07 DIAGNOSIS — H53149 Visual discomfort, unspecified: Secondary | ICD-10-CM | POA: Diagnosis not present

## 2019-09-07 DIAGNOSIS — T502X5A Adverse effect of carbonic-anhydrase inhibitors, benzothiadiazides and other diuretics, initial encounter: Secondary | ICD-10-CM | POA: Diagnosis not present

## 2019-09-07 DIAGNOSIS — Z886 Allergy status to analgesic agent status: Secondary | ICD-10-CM | POA: Diagnosis not present

## 2019-09-07 DIAGNOSIS — G43109 Migraine with aura, not intractable, without status migrainosus: Secondary | ICD-10-CM | POA: Diagnosis not present

## 2019-09-08 DIAGNOSIS — G43701 Chronic migraine without aura, not intractable, with status migrainosus: Secondary | ICD-10-CM | POA: Diagnosis not present

## 2019-09-08 DIAGNOSIS — G919 Hydrocephalus, unspecified: Secondary | ICD-10-CM | POA: Diagnosis not present

## 2019-09-08 DIAGNOSIS — E878 Other disorders of electrolyte and fluid balance, not elsewhere classified: Secondary | ICD-10-CM | POA: Diagnosis not present

## 2019-09-08 DIAGNOSIS — G9389 Other specified disorders of brain: Secondary | ICD-10-CM | POA: Diagnosis not present

## 2019-09-08 DIAGNOSIS — Z888 Allergy status to other drugs, medicaments and biological substances status: Secondary | ICD-10-CM | POA: Diagnosis not present

## 2019-09-08 DIAGNOSIS — Y92230 Patient room in hospital as the place of occurrence of the external cause: Secondary | ICD-10-CM | POA: Diagnosis not present

## 2019-09-08 DIAGNOSIS — G935 Compression of brain: Secondary | ICD-10-CM | POA: Diagnosis not present

## 2019-09-08 DIAGNOSIS — E876 Hypokalemia: Secondary | ICD-10-CM | POA: Diagnosis not present

## 2019-09-08 DIAGNOSIS — Z01818 Encounter for other preprocedural examination: Secondary | ICD-10-CM | POA: Diagnosis not present

## 2019-09-08 DIAGNOSIS — G932 Benign intracranial hypertension: Secondary | ICD-10-CM | POA: Diagnosis not present

## 2019-09-08 DIAGNOSIS — E872 Acidosis: Secondary | ICD-10-CM | POA: Diagnosis not present

## 2019-09-08 DIAGNOSIS — R519 Headache, unspecified: Secondary | ICD-10-CM | POA: Diagnosis not present

## 2019-09-08 DIAGNOSIS — F411 Generalized anxiety disorder: Secondary | ICD-10-CM | POA: Diagnosis not present

## 2019-09-08 DIAGNOSIS — G9519 Other vascular myelopathies: Secondary | ICD-10-CM | POA: Diagnosis not present

## 2019-09-08 DIAGNOSIS — G918 Other hydrocephalus: Secondary | ICD-10-CM | POA: Diagnosis not present

## 2019-09-08 DIAGNOSIS — M2578 Osteophyte, vertebrae: Secondary | ICD-10-CM | POA: Diagnosis not present

## 2019-09-08 DIAGNOSIS — G43919 Migraine, unspecified, intractable, without status migrainosus: Secondary | ICD-10-CM | POA: Diagnosis not present

## 2019-09-08 DIAGNOSIS — T502X5A Adverse effect of carbonic-anhydrase inhibitors, benzothiadiazides and other diuretics, initial encounter: Secondary | ICD-10-CM | POA: Diagnosis not present

## 2019-09-08 DIAGNOSIS — Z20822 Contact with and (suspected) exposure to covid-19: Secondary | ICD-10-CM | POA: Diagnosis not present

## 2019-09-08 DIAGNOSIS — G43109 Migraine with aura, not intractable, without status migrainosus: Secondary | ICD-10-CM | POA: Diagnosis not present

## 2019-09-08 DIAGNOSIS — R001 Bradycardia, unspecified: Secondary | ICD-10-CM | POA: Diagnosis not present

## 2019-09-08 DIAGNOSIS — Z886 Allergy status to analgesic agent status: Secondary | ICD-10-CM | POA: Diagnosis not present

## 2019-09-08 DIAGNOSIS — G910 Communicating hydrocephalus: Secondary | ICD-10-CM | POA: Diagnosis not present

## 2019-09-08 DIAGNOSIS — D72829 Elevated white blood cell count, unspecified: Secondary | ICD-10-CM | POA: Diagnosis not present

## 2019-09-08 DIAGNOSIS — G911 Obstructive hydrocephalus: Secondary | ICD-10-CM | POA: Diagnosis not present

## 2019-09-08 DIAGNOSIS — R112 Nausea with vomiting, unspecified: Secondary | ICD-10-CM | POA: Diagnosis not present

## 2019-09-08 DIAGNOSIS — H4710 Unspecified papilledema: Secondary | ICD-10-CM | POA: Diagnosis not present

## 2019-09-08 MED ORDER — LACTATED RINGERS IV SOLN
INTRAVENOUS | Status: DC
Start: ? — End: 2019-09-08

## 2019-09-08 MED ORDER — ACETAZOLAMIDE 250 MG PO TABS
500.00 | ORAL_TABLET | ORAL | Status: DC
Start: 2019-09-08 — End: 2019-09-08

## 2019-09-08 MED ORDER — ONDANSETRON HCL 4 MG/2ML IJ SOLN
4.00 | INTRAMUSCULAR | Status: DC
Start: ? — End: 2019-09-08

## 2019-09-08 MED ORDER — DSS 100 MG PO CAPS
100.00 | ORAL_CAPSULE | ORAL | Status: DC
Start: ? — End: 2019-09-08

## 2019-09-08 MED ORDER — ACETAMINOPHEN 325 MG PO TABS
650.00 | ORAL_TABLET | ORAL | Status: DC
Start: ? — End: 2019-09-08

## 2019-09-08 MED ORDER — TEMAZEPAM 15 MG PO CAPS
15.00 | ORAL_CAPSULE | ORAL | Status: DC
Start: ? — End: 2019-09-08

## 2019-09-08 MED ORDER — PROMETHAZINE HCL 25 MG PO TABS
25.00 | ORAL_TABLET | ORAL | Status: DC
Start: ? — End: 2019-09-08

## 2019-09-08 MED ORDER — POTASSIUM CHLORIDE CRYS ER 20 MEQ PO TBCR
20.00 | EXTENDED_RELEASE_TABLET | ORAL | Status: DC
Start: 2019-09-08 — End: 2019-09-08

## 2019-09-08 MED ORDER — BENZONATATE 100 MG PO CAPS
100.00 | ORAL_CAPSULE | ORAL | Status: DC
Start: ? — End: 2019-09-08

## 2019-09-08 MED ORDER — DIPHENHYDRAMINE HCL 25 MG PO CAPS
25.00 | ORAL_CAPSULE | ORAL | Status: DC
Start: ? — End: 2019-09-08

## 2019-09-09 DIAGNOSIS — R001 Bradycardia, unspecified: Secondary | ICD-10-CM | POA: Diagnosis not present

## 2019-09-09 DIAGNOSIS — R519 Headache, unspecified: Secondary | ICD-10-CM | POA: Diagnosis not present

## 2019-09-09 DIAGNOSIS — G911 Obstructive hydrocephalus: Secondary | ICD-10-CM | POA: Diagnosis not present

## 2019-09-09 DIAGNOSIS — R112 Nausea with vomiting, unspecified: Secondary | ICD-10-CM | POA: Diagnosis not present

## 2019-09-09 DIAGNOSIS — G910 Communicating hydrocephalus: Secondary | ICD-10-CM | POA: Diagnosis not present

## 2019-09-09 DIAGNOSIS — G919 Hydrocephalus, unspecified: Secondary | ICD-10-CM | POA: Diagnosis not present

## 2019-09-09 DIAGNOSIS — D72829 Elevated white blood cell count, unspecified: Secondary | ICD-10-CM | POA: Diagnosis not present

## 2019-09-09 DIAGNOSIS — E876 Hypokalemia: Secondary | ICD-10-CM | POA: Diagnosis not present

## 2019-09-11 DIAGNOSIS — R112 Nausea with vomiting, unspecified: Secondary | ICD-10-CM | POA: Diagnosis not present

## 2019-09-11 DIAGNOSIS — G911 Obstructive hydrocephalus: Secondary | ICD-10-CM | POA: Diagnosis not present

## 2019-09-11 DIAGNOSIS — G919 Hydrocephalus, unspecified: Secondary | ICD-10-CM | POA: Diagnosis not present

## 2019-09-11 DIAGNOSIS — R519 Headache, unspecified: Secondary | ICD-10-CM | POA: Diagnosis not present

## 2019-09-11 DIAGNOSIS — G910 Communicating hydrocephalus: Secondary | ICD-10-CM | POA: Diagnosis not present

## 2019-09-11 DIAGNOSIS — Z01818 Encounter for other preprocedural examination: Secondary | ICD-10-CM | POA: Diagnosis not present

## 2019-09-11 DIAGNOSIS — E878 Other disorders of electrolyte and fluid balance, not elsewhere classified: Secondary | ICD-10-CM | POA: Diagnosis not present

## 2019-09-11 DIAGNOSIS — E876 Hypokalemia: Secondary | ICD-10-CM | POA: Diagnosis not present

## 2019-09-15 DIAGNOSIS — R5383 Other fatigue: Secondary | ICD-10-CM | POA: Diagnosis not present

## 2019-09-15 DIAGNOSIS — R200 Anesthesia of skin: Secondary | ICD-10-CM | POA: Diagnosis not present

## 2019-09-15 DIAGNOSIS — K921 Melena: Secondary | ICD-10-CM | POA: Diagnosis not present

## 2019-09-15 DIAGNOSIS — R519 Headache, unspecified: Secondary | ICD-10-CM | POA: Diagnosis not present

## 2019-09-15 DIAGNOSIS — G919 Hydrocephalus, unspecified: Secondary | ICD-10-CM | POA: Diagnosis not present

## 2019-09-15 DIAGNOSIS — R531 Weakness: Secondary | ICD-10-CM | POA: Diagnosis not present

## 2019-09-15 DIAGNOSIS — R42 Dizziness and giddiness: Secondary | ICD-10-CM | POA: Diagnosis not present

## 2019-09-15 DIAGNOSIS — R000 Tachycardia, unspecified: Secondary | ICD-10-CM | POA: Diagnosis not present

## 2019-09-15 DIAGNOSIS — G910 Communicating hydrocephalus: Secondary | ICD-10-CM | POA: Diagnosis not present

## 2019-09-15 DIAGNOSIS — Z982 Presence of cerebrospinal fluid drainage device: Secondary | ICD-10-CM | POA: Diagnosis not present

## 2019-09-15 DIAGNOSIS — G911 Obstructive hydrocephalus: Secondary | ICD-10-CM | POA: Diagnosis not present

## 2019-09-15 DIAGNOSIS — R112 Nausea with vomiting, unspecified: Secondary | ICD-10-CM | POA: Diagnosis not present

## 2019-09-15 DIAGNOSIS — G8191 Hemiplegia, unspecified affecting right dominant side: Secondary | ICD-10-CM | POA: Diagnosis not present

## 2019-09-26 ENCOUNTER — Ambulatory Visit: Payer: BC Managed Care – PPO | Admitting: Neurology

## 2019-11-11 DIAGNOSIS — B379 Candidiasis, unspecified: Secondary | ICD-10-CM | POA: Diagnosis not present

## 2019-11-11 DIAGNOSIS — L299 Pruritus, unspecified: Secondary | ICD-10-CM | POA: Diagnosis not present

## 2020-01-10 DIAGNOSIS — H1031 Unspecified acute conjunctivitis, right eye: Secondary | ICD-10-CM | POA: Diagnosis not present

## 2020-01-13 DIAGNOSIS — Z6824 Body mass index (BMI) 24.0-24.9, adult: Secondary | ICD-10-CM | POA: Diagnosis not present

## 2020-01-13 DIAGNOSIS — Z01411 Encounter for gynecological examination (general) (routine) with abnormal findings: Secondary | ICD-10-CM | POA: Diagnosis not present

## 2020-01-13 DIAGNOSIS — N926 Irregular menstruation, unspecified: Secondary | ICD-10-CM | POA: Diagnosis not present

## 2020-01-15 DIAGNOSIS — Z03818 Encounter for observation for suspected exposure to other biological agents ruled out: Secondary | ICD-10-CM | POA: Diagnosis not present

## 2020-01-15 DIAGNOSIS — Z20822 Contact with and (suspected) exposure to covid-19: Secondary | ICD-10-CM | POA: Diagnosis not present

## 2020-01-23 DIAGNOSIS — G911 Obstructive hydrocephalus: Secondary | ICD-10-CM | POA: Diagnosis not present

## 2020-01-23 DIAGNOSIS — H04123 Dry eye syndrome of bilateral lacrimal glands: Secondary | ICD-10-CM | POA: Diagnosis not present

## 2020-01-23 DIAGNOSIS — H1045 Other chronic allergic conjunctivitis: Secondary | ICD-10-CM | POA: Diagnosis not present

## 2020-01-23 DIAGNOSIS — G43909 Migraine, unspecified, not intractable, without status migrainosus: Secondary | ICD-10-CM | POA: Diagnosis not present

## 2020-01-25 DIAGNOSIS — Z20822 Contact with and (suspected) exposure to covid-19: Secondary | ICD-10-CM | POA: Diagnosis not present

## 2020-01-27 DIAGNOSIS — U071 COVID-19: Secondary | ICD-10-CM | POA: Diagnosis not present

## 2020-01-27 DIAGNOSIS — J101 Influenza due to other identified influenza virus with other respiratory manifestations: Secondary | ICD-10-CM | POA: Diagnosis not present

## 2020-01-27 DIAGNOSIS — Z20828 Contact with and (suspected) exposure to other viral communicable diseases: Secondary | ICD-10-CM | POA: Diagnosis not present

## 2020-01-27 DIAGNOSIS — B974 Respiratory syncytial virus as the cause of diseases classified elsewhere: Secondary | ICD-10-CM | POA: Diagnosis not present

## 2020-03-20 DIAGNOSIS — Z1231 Encounter for screening mammogram for malignant neoplasm of breast: Secondary | ICD-10-CM | POA: Diagnosis not present

## 2020-04-06 DIAGNOSIS — D251 Intramural leiomyoma of uterus: Secondary | ICD-10-CM | POA: Diagnosis not present

## 2020-04-06 DIAGNOSIS — Z3040 Encounter for surveillance of contraceptives, unspecified: Secondary | ICD-10-CM | POA: Diagnosis not present

## 2020-04-06 DIAGNOSIS — N926 Irregular menstruation, unspecified: Secondary | ICD-10-CM | POA: Diagnosis not present

## 2020-08-04 DIAGNOSIS — Z3041 Encounter for surveillance of contraceptive pills: Secondary | ICD-10-CM | POA: Diagnosis not present

## 2020-08-20 DIAGNOSIS — L259 Unspecified contact dermatitis, unspecified cause: Secondary | ICD-10-CM | POA: Diagnosis not present

## 2021-01-12 ENCOUNTER — Other Ambulatory Visit: Payer: Self-pay | Admitting: Family Medicine

## 2021-01-12 ENCOUNTER — Other Ambulatory Visit: Payer: Self-pay

## 2021-01-12 ENCOUNTER — Ambulatory Visit
Admission: RE | Admit: 2021-01-12 | Discharge: 2021-01-12 | Disposition: A | Discharge status: Home / Self Care | Payer: BC Managed Care – PPO | Source: Ambulatory Visit | Attending: Family Medicine | Admitting: Family Medicine

## 2021-01-12 DIAGNOSIS — M549 Dorsalgia, unspecified: Secondary | ICD-10-CM | POA: Diagnosis not present

## 2021-01-12 DIAGNOSIS — M545 Low back pain, unspecified: Secondary | ICD-10-CM | POA: Diagnosis not present

## 2021-01-12 DIAGNOSIS — M9903 Segmental and somatic dysfunction of lumbar region: Secondary | ICD-10-CM | POA: Diagnosis not present

## 2021-01-12 DIAGNOSIS — M609 Myositis, unspecified: Secondary | ICD-10-CM | POA: Diagnosis not present

## 2021-01-12 DIAGNOSIS — M5416 Radiculopathy, lumbar region: Secondary | ICD-10-CM | POA: Diagnosis not present

## 2021-01-12 DIAGNOSIS — M5137 Other intervertebral disc degeneration, lumbosacral region: Secondary | ICD-10-CM | POA: Diagnosis not present

## 2021-01-19 DIAGNOSIS — Z3040 Encounter for surveillance of contraceptives, unspecified: Secondary | ICD-10-CM | POA: Diagnosis not present

## 2021-01-19 DIAGNOSIS — Z6824 Body mass index (BMI) 24.0-24.9, adult: Secondary | ICD-10-CM | POA: Diagnosis not present

## 2021-01-19 DIAGNOSIS — Z01419 Encounter for gynecological examination (general) (routine) without abnormal findings: Secondary | ICD-10-CM | POA: Diagnosis not present

## 2021-02-08 DIAGNOSIS — G43909 Migraine, unspecified, not intractable, without status migrainosus: Secondary | ICD-10-CM | POA: Diagnosis not present

## 2021-02-08 DIAGNOSIS — H04123 Dry eye syndrome of bilateral lacrimal glands: Secondary | ICD-10-CM | POA: Diagnosis not present

## 2021-02-08 DIAGNOSIS — H1045 Other chronic allergic conjunctivitis: Secondary | ICD-10-CM | POA: Diagnosis not present

## 2021-02-08 DIAGNOSIS — G911 Obstructive hydrocephalus: Secondary | ICD-10-CM | POA: Diagnosis not present

## 2021-03-22 DIAGNOSIS — Z1231 Encounter for screening mammogram for malignant neoplasm of breast: Secondary | ICD-10-CM | POA: Diagnosis not present

## 2021-05-25 IMAGING — CT CT ABD-PELV W/ CM
2 of 5 series · 16 of 46 positions shown, 18 images · IV contrast (omnipaque)
Comparison: None.

CLINICAL DATA: Persistent nausea and vomiting.

EXAM:
CT ABDOMEN AND PELVIS WITH CONTRAST
TECHNIQUE: Multidetector CT imaging of the abdomen and pelvis was performed
using the standard protocol following bolus administration of
intravenous contrast.
CONTRAST:  100mL OMNIPAQUE IOHEXOL 300 MG/ML  SOLN

[Series 2: axial st · axial · 0.71mm/px · z∈[-616,-236]mm · 13 of 86 slices shown, 15 images]
[im 5/86  soft-tissue]
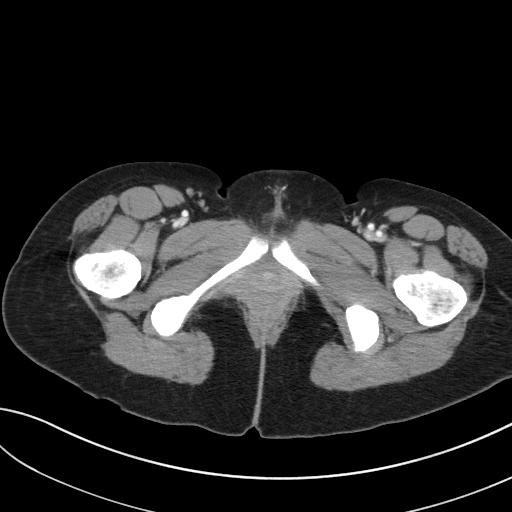
[im 5/86  bone]
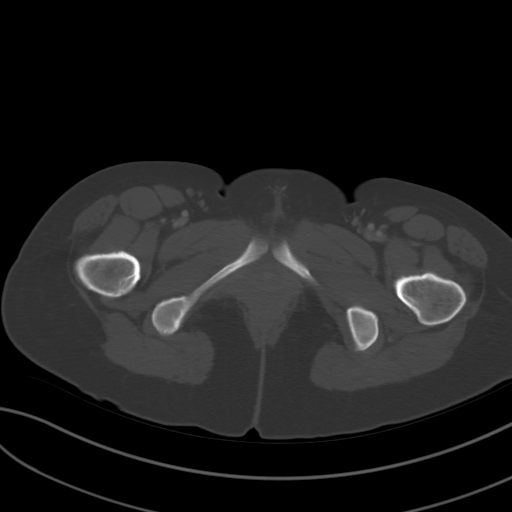
[im 10/86  soft-tissue]
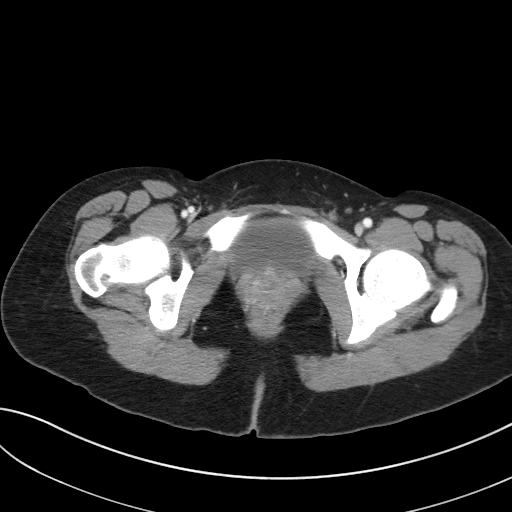
[im 19/86  soft-tissue]
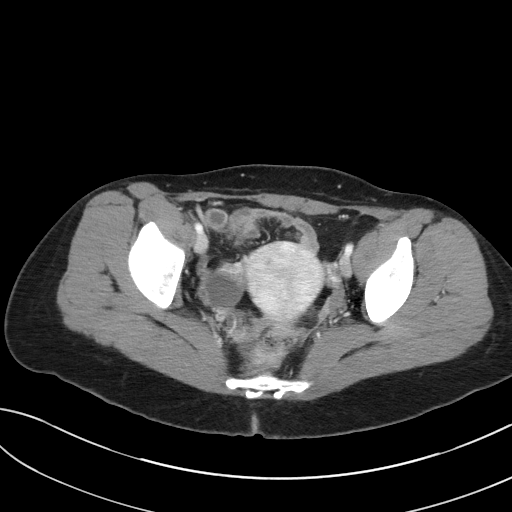
[im 24/86  soft-tissue]
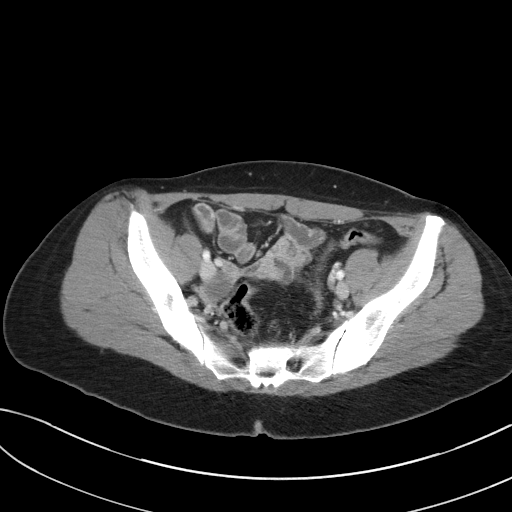
[im 29/86  soft-tissue]
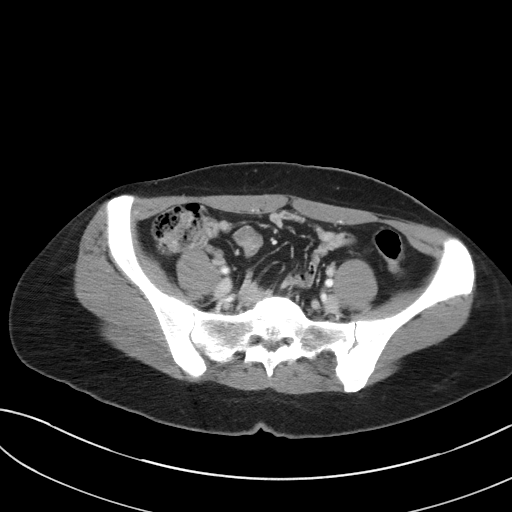
[im 38/86  soft-tissue]
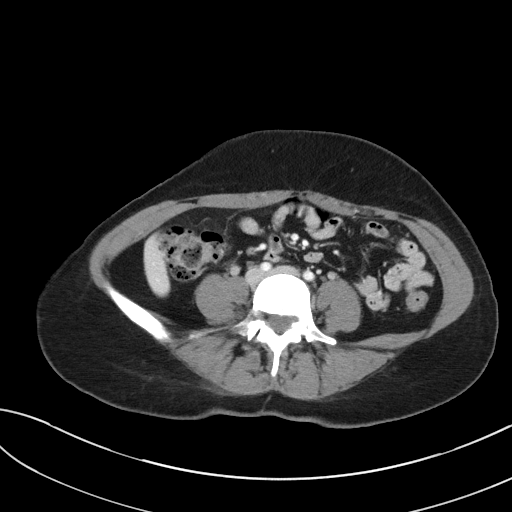
[im 43/86  soft-tissue]
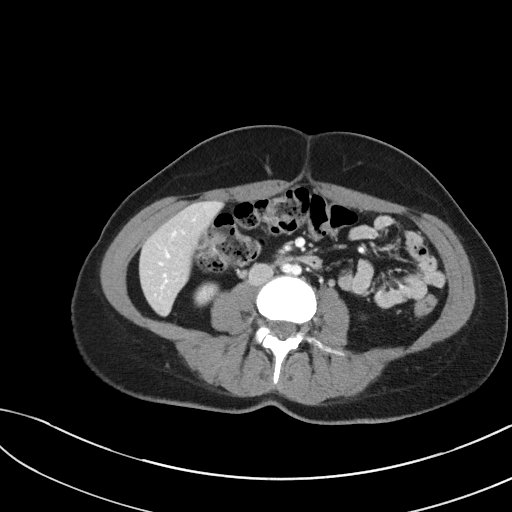
[im 48/86  soft-tissue]
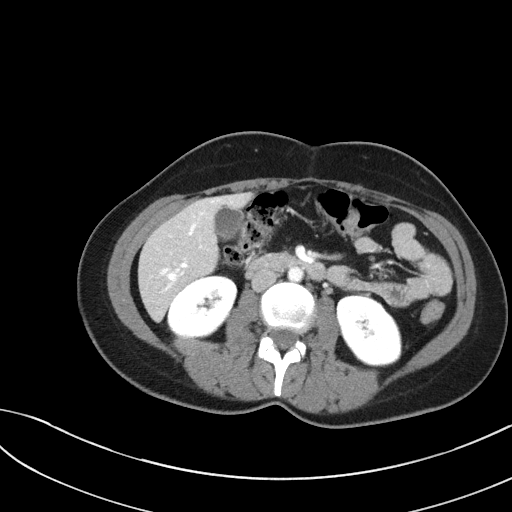
[im 57/86  soft-tissue]
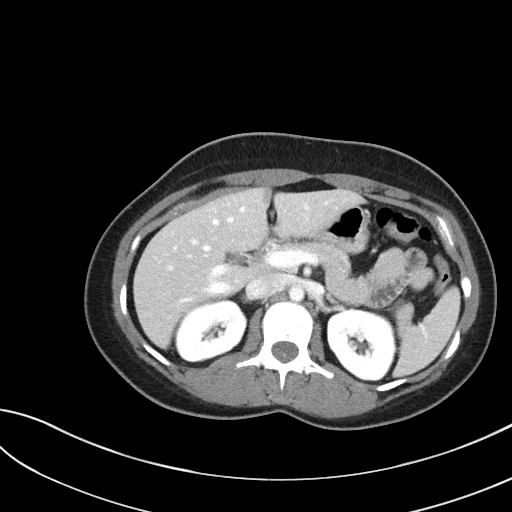
[im 57/86  bone]
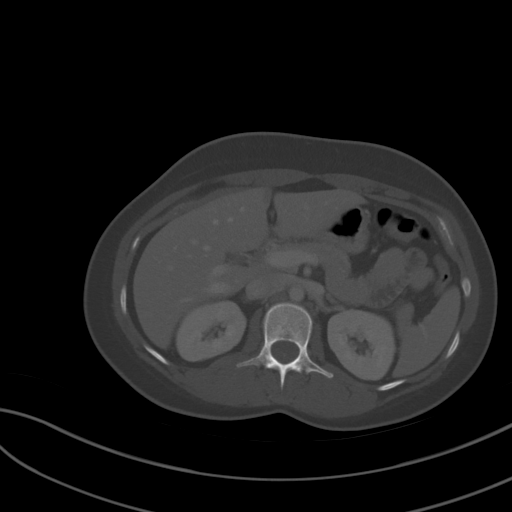
[im 62/86  soft-tissue]
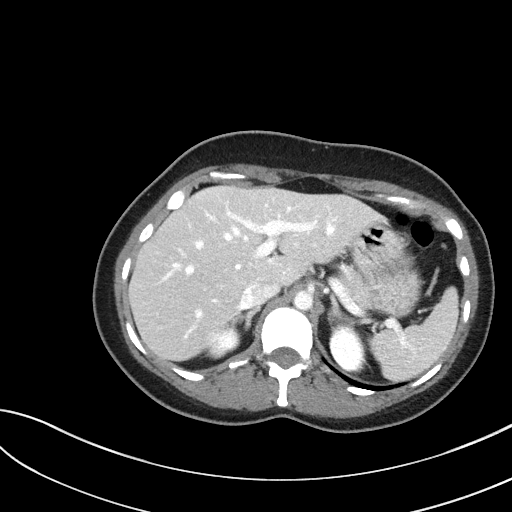
[im 67/86  soft-tissue]
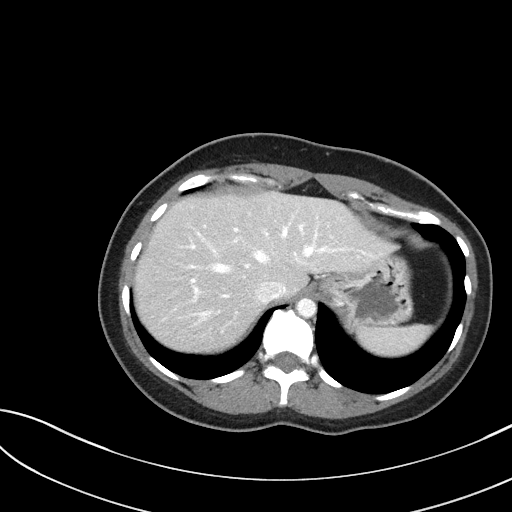
[im 76/86  soft-tissue]
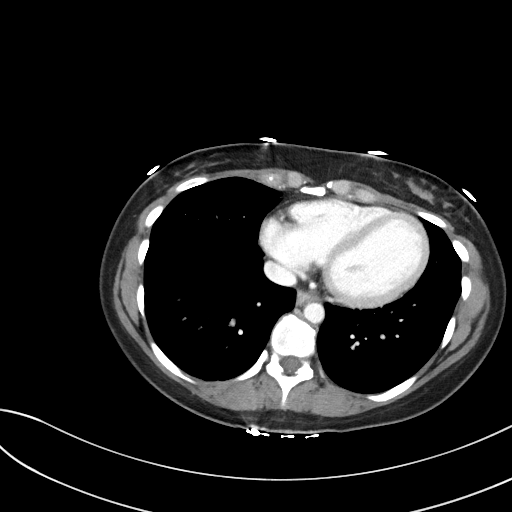
[im 81/86  soft-tissue]
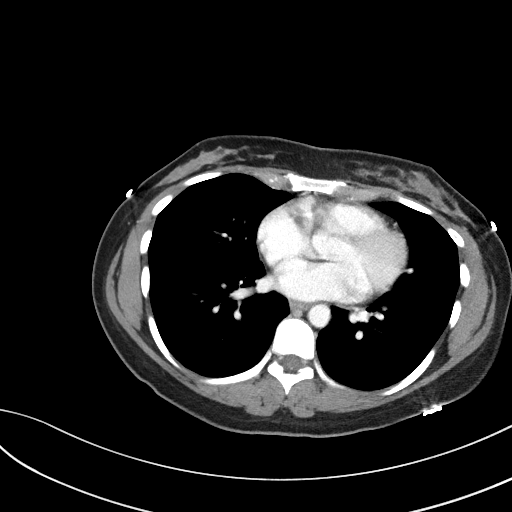

[Series 5: coronal st · coronal · 0.60mm/px · 3 of 70 slices shown]
[im 24/70  soft-tissue]
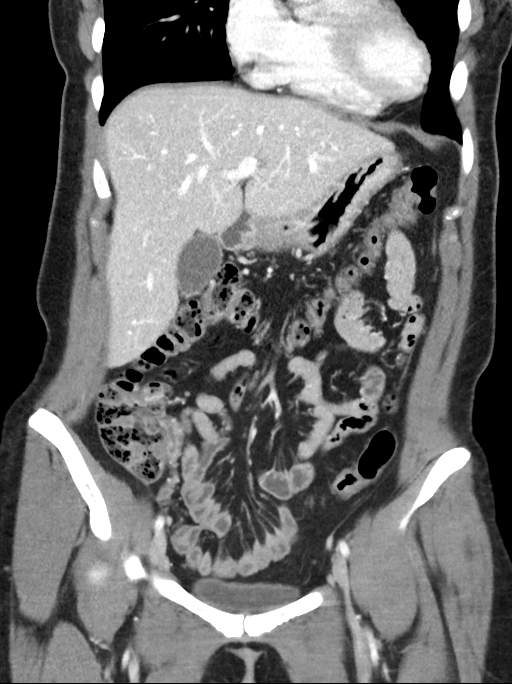
[im 31/70  soft-tissue]
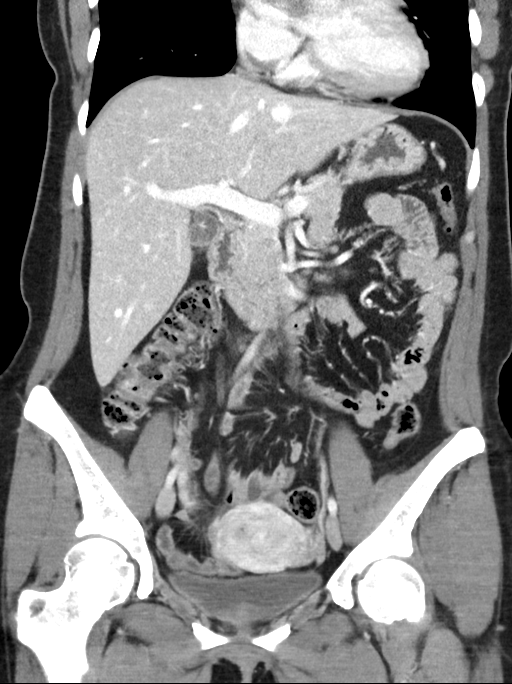
[im 39/70  soft-tissue]
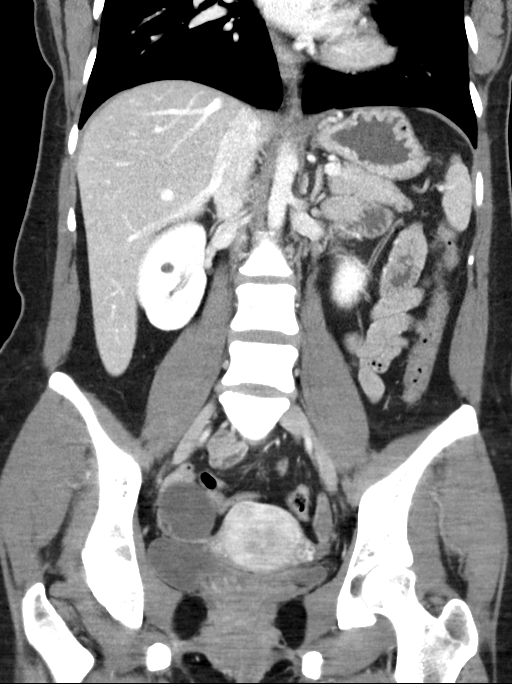

[16 of 46 positions shown; findings below may reference images not displayed]

FINDINGS: Lower chest: The lung bases are clear. The heart size is normal.

Hepatobiliary: The liver is normal. Normal gallbladder.There is no
biliary ductal dilation.

Pancreas: Normal contours without ductal dilatation. No
peripancreatic fluid collection.

Spleen: Unremarkable.

Adrenals/Urinary Tract:

--Adrenal glands: Unremarkable.

--Right kidney/ureter: No hydronephrosis or radiopaque kidney
stones.

--Left kidney/ureter: No hydronephrosis or radiopaque kidney stones.

--Urinary bladder: Unremarkable.

Stomach/Bowel:

--Stomach/Duodenum: No hiatal hernia or other gastric abnormality.
Normal duodenal course and caliber.

--Small bowel: Unremarkable.

--Colon: Unremarkable.

--Appendix: Normal.

Vascular/Lymphatic: Normal course and caliber of the major abdominal
vessels.

--No retroperitoneal lymphadenopathy.

--No mesenteric lymphadenopathy.

--No pelvic or inguinal lymphadenopathy.

Reproductive: There is a 2.9 cm right ovarian cyst. No further
follow-up is required.

Other: No ascites or free air. The abdominal wall is normal.

Musculoskeletal. No acute displaced fractures.
IMPRESSION: No acute abdominopelvic abnormality.

## 2021-06-29 ENCOUNTER — Encounter: Payer: Self-pay | Admitting: Family Medicine

## 2021-06-29 ENCOUNTER — Ambulatory Visit (INDEPENDENT_AMBULATORY_CARE_PROVIDER_SITE_OTHER): Payer: BC Managed Care – PPO | Admitting: Family Medicine

## 2021-06-29 VITALS — BP 110/72 | HR 60 | Temp 98.0°F | Ht 62.0 in | Wt 133.4 lb

## 2021-06-29 DIAGNOSIS — Z0000 Encounter for general adult medical examination without abnormal findings: Secondary | ICD-10-CM

## 2021-06-29 DIAGNOSIS — Z1159 Encounter for screening for other viral diseases: Secondary | ICD-10-CM | POA: Diagnosis not present

## 2021-06-29 LAB — CBC WITH DIFFERENTIAL/PLATELET
Basophils Absolute: 0 10*3/uL (ref 0.0–0.1)
Basophils Relative: 0.4 % (ref 0.0–3.0)
Eosinophils Absolute: 0.2 10*3/uL (ref 0.0–0.7)
Eosinophils Relative: 3.5 % (ref 0.0–5.0)
HCT: 41 % (ref 36.0–46.0)
Hemoglobin: 13.7 g/dL (ref 12.0–15.0)
Lymphocytes Relative: 34.4 % (ref 12.0–46.0)
Lymphs Abs: 1.9 10*3/uL (ref 0.7–4.0)
MCHC: 33.3 g/dL (ref 30.0–36.0)
MCV: 91.4 fl (ref 78.0–100.0)
Monocytes Absolute: 0.4 10*3/uL (ref 0.1–1.0)
Monocytes Relative: 7.4 % (ref 3.0–12.0)
Neutro Abs: 3 10*3/uL (ref 1.4–7.7)
Neutrophils Relative %: 54.3 % (ref 43.0–77.0)
Platelets: 207 10*3/uL (ref 150.0–400.0)
RBC: 4.49 Mil/uL (ref 3.87–5.11)
RDW: 12.1 % (ref 11.5–15.5)
WBC: 5.5 10*3/uL (ref 4.0–10.5)

## 2021-06-29 LAB — LIPID PANEL
Cholesterol: 221 mg/dL — ABNORMAL HIGH (ref 0–200)
HDL: 55.9 mg/dL (ref >39.00)
LDL Cholesterol: 146 mg/dL — ABNORMAL HIGH (ref 0–99)
NonHDL: 165.27
Total CHOL/HDL Ratio: 4
Triglycerides: 96 mg/dL (ref 0.0–149.0)
VLDL: 19.2 mg/dL (ref 0.0–40.0)

## 2021-06-29 LAB — COMPREHENSIVE METABOLIC PANEL
ALT: 18 U/L (ref 0–35)
AST: 14 U/L (ref 0–37)
Albumin: 4.3 g/dL (ref 3.5–5.2)
Alkaline Phosphatase: 46 U/L (ref 39–117)
BUN: 9 mg/dL (ref 6–23)
CO2: 28 mEq/L (ref 19–32)
Calcium: 9.3 mg/dL (ref 8.4–10.5)
Chloride: 102 mEq/L (ref 96–112)
Creatinine, Ser: 0.56 mg/dL (ref 0.40–1.20)
GFR: 112.98 mL/min (ref >60.00)
Glucose, Bld: 77 mg/dL (ref 70–99)
Potassium: 3.8 mEq/L (ref 3.5–5.1)
Sodium: 138 mEq/L (ref 135–145)
Total Bilirubin: 0.5 mg/dL (ref 0.2–1.2)
Total Protein: 6.9 g/dL (ref 6.0–8.3)

## 2021-06-29 LAB — TSH: TSH: 4.9 u[IU]/mL (ref 0.35–5.50)

## 2021-06-29 NOTE — Patient Instructions (Signed)
Welcome to [facility] at [facility]! It was a pleasure meeting you today. ° °As discussed, Please schedule a 12 month follow up visit today. ° °PLEASE NOTE: ° °If you had any LAB tests please let us know if you have not heard back within a few days. You may see your results on MyChart before we have a chance to review them but we will give you a call once they are reviewed by us. If we ordered any REFERRALS today, please let us know if you have not heard from their office within the next week.  °Let us know through MyChart if you are needing REFILLS, or have your pharmacy send us the request. You can also use MyChart to communicate with me or any office staff. ° °Please try these tips to maintain a healthy lifestyle: ° °Eat most of your calories during the day when you are active. Eliminate processed foods including packaged sweets (pies, cakes, cookies), reduce intake of potatoes, white bread, white pasta, and white rice. Look for whole grain options, oat flour or almond flour. ° °Each meal should contain half fruits/vegetables, one quarter protein, and one quarter carbs (no bigger than a computer mouse). ° °Cut down on sweet beverages. This includes juice, soda, and sweet tea. Also watch fruit intake, though this is a healthier sweet option, it still contains natural sugar! Limit to 3 servings daily. ° °Drink at least 1 glass of water with each meal and aim for at least 8 glasses per day ° °Exercise at least 150 minutes every week.   °

## 2021-06-29 NOTE — Progress Notes (Signed)
?Phone [phone] ?  ?Subjective:  ? ?Patient is a 41 y.o. female presenting for annual physical.   ? ?No chief complaint on file. ? ?New pt here for annual physical-doctor retired ? ?See problem oriented charting- ?Review of Systems  ?HENT:  Negative for congestion, ear pain, hearing loss and sore throat.   ?Eyes:  Negative for blurred vision and double vision.  ?Respiratory:  Negative for cough, shortness of breath and wheezing.   ?Cardiovascular:  Negative for chest pain, palpitations and leg swelling.  ?Gastrointestinal:  Negative for abdominal pain, constipation, diarrhea, heartburn, nausea and vomiting.  ?Genitourinary:  Negative for dysuria and frequency.  ?Musculoskeletal:  Negative for joint pain and myalgias.  ?Neurological:  Negative for dizziness and headaches.  ?Psychiatric/Behavioral:  Negative for depression and suicidal ideas. The patient is not nervous/anxious.  -  ? ?Has fibroids so on OCP-Pap 2019-seing gyn.  Mamm done Dec.  ?HA 2 days ago back of head.  ? ?The following were reviewed and entered/updated in epic: ?Past Medical History:  ?Diagnosis Date  ? Allergy   ? Hydrocephalus ([facility])   ? Medical history non-contributory   ? Pregnant   ? ?Patient Active Problem List  ? Diagnosis Date Noted  ? NSVD (normal spontaneous vaginal delivery) 11/11/2013  ? ?Past Surgical History:  ?Procedure Laterality Date  ? BRAIN SURGERY    ? shunt placed in May 2021  ? LASIK Right 2017  ? NO PAST SURGERIES    ? ? ?Family History  ?Problem Relation Age of Onset  ? Diabetes Mother   ? Asthma Sister   ? Asthma Sister   ? Hypertension Maternal Grandmother   ? Diabetes Maternal Grandmother   ? Hypertension Maternal Grandfather   ? ? ?Medications- reviewed and updated ?Current Outpatient Medications  ?Medication Sig Dispense Refill  ? fexofenadine (ALLEGRA) 180 MG tablet Take 180 mg by mouth daily.    ? JUNEL 1/20 1-20 MG-MCG tablet Take 1 tablet by mouth daily.    ? ?No current facility-administered medications for  this visit.  ? ? ?Allergies-reviewed and updated ?Allergies  ?Allergen Reactions  ? Aleve [Naproxen] Other (See Comments)  ?  Dizziness  ? ? ?Social History  ? ?Social History Narrative  ? Right handed  ? 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 ?Objective  ?Objective:  ?BP 110/72   Pulse 60   Temp 98 ?F (36.7 ?C) (Temporal)   Ht 5\' 2"  (1.575 m)   Wt 133 lb 6 oz (60.5 kg)   LMP 06/20/2021 (Exact Date)   SpO2 99%   Breastfeeding Unknown   BMI 24.39 kg/m?  ?Physical Exam ?Constitutional:   ?   Appearance: Normal appearance.  ?HENT:  ?   Head: Normocephalic and atraumatic.  ?   Right Ear: Tympanic membrane, ear canal and external ear normal.  ?   Left Ear: Tympanic membrane, ear canal and external ear normal.  ?   Nose: Nose normal.  ?   Mouth/Throat:  ?   Mouth: Mucous membranes are moist.  ?   Pharynx: Oropharynx is clear.  ?Eyes:  ?   General: No scleral icterus. ?   Extraocular Movements: Extraocular movements intact.  ?   Conjunctiva/sclera: Conjunctivae normal.  ?   Pupils: Pupils are equal, round, and reactive to light.  ?Neck:  ?   Vascular: No carotid bruit.  ?Cardiovascular:  ?   Rate and Rhythm: Normal rate and regular rhythm.  ?   Pulses: Normal pulses.  ?  Heart sounds: Normal heart sounds. No murmur heard. ?Pulmonary:  ?   Effort: Pulmonary effort is normal.  ?   Breath sounds: Normal breath sounds.  ?Abdominal:  ?   General: Bowel sounds are normal. There is no distension.  ?   Palpations: Abdomen is soft. There is no mass.  ?   Tenderness: There is no abdominal tenderness.  ?Musculoskeletal:  ?   Cervical back: Neck supple.  ?   Right lower leg: No edema.  ?   Left lower leg: No edema.  ?Lymphadenopathy:  ?   Cervical: No cervical adenopathy.  ?Neurological:  ?   General: No focal deficit present.  ?   Mental Status: She is alert and oriented to person, place, and time.  ?Psychiatric:     ?   Mood and Affect: Mood normal.  ?  ? ? ?  ?Assessment and Plan  ? ?Health Maintenance counseling: ?1. Anticipatory  guidance: Patient counseled regarding regular dental exams q6 months, eye exams,  avoiding smoking and second hand smoke, limiting alcohol to 1 beverage per day, no illicit drugs.   ?2. Risk factor reduction:  Advised patient of need for regular exercise and diet rich and fruits and vegetables to reduce risk of heart attack and stroke. Exercise- tries.  ?Wt Readings from Last 3 Encounters:  ?06/29/21 133 lb 6 oz (60.5 kg)  ?09/03/19 129 lb 8 oz (58.7 kg)  ?08/20/19 131 lb (59.4 kg)  ? ?3. Immunizations/screenings/ancillary studies ?Immunization History  ?Administered Date(s) Administered  ? PFIZER(Purple Top)SARS-COV-2 Vaccination 06/27/2019, 07/18/2019, 03/26/2020  ? Tdap 09/11/2013  ? ?Health Maintenance Due  ?Topic Date Due  ? Hepatitis C Screening  Never done  ? PAP SMEAR-Modifier  Never done  ?  ?4. Cervical cancer screening- sees gyn ?5. Breast cancer screening-  mammogram 03/2021 ?6. Colon cancer screening - n/a ?7. Skin cancer screening- advised regular sunscreen use. Denies worrisome, changing, or new skin lesions.  ?8. Birth control/STD check- on ocp for cycles ?9. Osteoporosis screening- n/a ?10. Smoking associated screening - non smoker ? ?Problem List Items Addressed This Visit   ?None ?Visit Diagnoses   ? ? Wellness examination    -  Primary  ? Relevant Orders  ? Hepatitis C antibody  ? Comprehensive metabolic panel  ? Lipid panel  ? TSH  ? CBC with Differential/Platelet  ? Encounter for hepatitis C screening test for low risk patient      ? Relevant Orders  ? Hepatitis C antibody  ? ?  ? ? ?Recommended follow up: yesReturn in about 1 year (around 06/30/2022) for annual. ?No future appointments. ? ? ?Lab/Order associations:yes fasting ?  ICD-10-CM   ?1. Wellness examination  Z00.00 Hepatitis C antibody  ?  Comprehensive metabolic panel  ?  Lipid panel  ?  TSH  ?  CBC with Differential/Platelet  ?  ?2. Encounter for hepatitis C screening test for low risk patient  Z11.59 Hepatitis C antibody  ?  ?  Cpx-antic guidance.  Check labs.  RHM UTD ? ?No orders of the defined types were placed in this encounter. ? ? ?Angelena Sole, MD ? ? ?

## 2021-06-30 LAB — HEPATITIS C ANTIBODY
Hepatitis C Ab: NONREACTIVE
SIGNAL TO CUT-OFF: <0.02 (ref <1.00)

## 2021-07-01 ENCOUNTER — Other Ambulatory Visit: Payer: Self-pay | Admitting: *Deleted

## 2022-01-04 ENCOUNTER — Other Ambulatory Visit (INDEPENDENT_AMBULATORY_CARE_PROVIDER_SITE_OTHER): Payer: BC Managed Care – PPO

## 2022-01-04 DIAGNOSIS — E785 Hyperlipidemia, unspecified: Secondary | ICD-10-CM

## 2022-01-04 DIAGNOSIS — R7989 Other specified abnormal findings of blood chemistry: Secondary | ICD-10-CM | POA: Diagnosis not present

## 2022-01-04 LAB — LIPID PANEL
Cholesterol: 239 mg/dL — ABNORMAL HIGH (ref 0–200)
HDL: 52.5 mg/dL (ref >39.00)
LDL Cholesterol: 162 mg/dL — ABNORMAL HIGH (ref 0–99)
NonHDL: 186.82
Total CHOL/HDL Ratio: 5
Triglycerides: 123 mg/dL (ref 0.0–149.0)
VLDL: 24.6 mg/dL (ref 0.0–40.0)

## 2022-01-04 LAB — TSH: TSH: 8.24 u[IU]/mL — ABNORMAL HIGH (ref 0.35–5.50)

## 2022-01-04 NOTE — Progress Notes (Signed)
No panic but labs worse.  Please sch appt to discuss

## 2022-01-07 ENCOUNTER — Ambulatory Visit: Payer: BC Managed Care – PPO | Admitting: Family Medicine

## 2022-01-07 ENCOUNTER — Encounter: Payer: Self-pay | Admitting: Family Medicine

## 2022-01-07 VITALS — BP 110/70 | HR 58 | Temp 98.2°F | Ht 62.0 in | Wt 136.0 lb

## 2022-01-07 DIAGNOSIS — E785 Hyperlipidemia, unspecified: Secondary | ICD-10-CM

## 2022-01-07 DIAGNOSIS — E038 Other specified hypothyroidism: Secondary | ICD-10-CM

## 2022-01-07 MED ORDER — LEVOTHYROXINE SODIUM 25 MCG PO TABS: 25.0000 ug | ORAL_TABLET | Freq: Every day | ORAL | 3 refills | Status: DC

## 2022-01-07 NOTE — Progress Notes (Signed)
Subjective:     Patient ID: Deborah Hines, female    DOB: 15-Jan-1981, 41 y.o.   MRN: [id number]  Chief Complaint  Patient presents with   Discuss recent blood work    HPI Discuss labs done on 9/19-had cpx 06/29/21 and chol and tsh elevated.  Repeat done HLD-working on diet/exercise-getting worse.  Walks daily.  Wt increasing.  No FH CAD/CVA/chol. Elevated TSH-worsening.  Now 8.24.  feels tired and bones achey and gaining wt. Feels like some swelling around eyes as well.   Health Maintenance Due  Topic Date Due   PAP SMEAR-Modifier  Never done   COVID-19 Vaccine (4 - Pfizer series) 05/21/2020   INFLUENZA VACCINE  Never done    Past Medical History:  Diagnosis Date   Allergy    Hydrocephalus [facility])    Medical history non-contributory    Pregnant     Past Surgical History:  Procedure Laterality Date   BRAIN SURGERY     shunt placed in May 2021   LASIK Right 2017   NO PAST SURGERIES      Outpatient Medications Prior to Visit  Medication Sig Dispense Refill   JUNEL 1/20 1-20 MG-MCG tablet Take 1 tablet by mouth daily.     fexofenadine (ALLEGRA) 180 MG tablet Take 180 mg by mouth daily.     No facility-administered medications prior to visit.    Allergies  Allergen Reactions   Naproxen Sodium     Other reaction(s): Dizziness (intolerance)   Nsaids    Naproxen Other (See Comments)    Dizziness Other reaction(s): dizziness Other reaction(s): Dizziness, Other Dizziness   ROS neg/noncontributory except as noted HPI/below Getting some hot flashes and night sweats.       Objective:     BP 110/70   Pulse (!) 58   Temp 98.2 F (36.8 C) (Temporal)   Ht 5\' 2"  (1.575 m)   Wt 136 lb (61.7 kg)   SpO2 97%   BMI 24.87 kg/m  Wt Readings from Last 3 Encounters:  01/07/22 136 lb (61.7 kg)  06/29/21 133 lb 6 oz (60.5 kg)  09/03/19 129 lb 8 oz (58.7 kg)    Physical Exam   Gen: WDWN NAD HEENT: NCAT, conjunctiva not injected, sclera nonicteric NECK:  supple, no  thyromegaly, no nodes, no carotid bruits CARDIAC: RRR, S1S2+, no murmur.  EXT:  no edema MSK: no gross abnormalities.  NEURO: A&O x3.  CN II-XII intact.  PSYCH: normal mood. Good eye contact  Discussed labs.     Assessment & Plan:   Problem List Items Addressed This Visit       Endocrine   Other specified hypothyroidism - Primary   Relevant Medications   levothyroxine (SYNTHROID) 25 MCG tablet   Other Relevant Orders   TSH   Comprehensive metabolic panel   T3, free   T4, free   Other Visit Diagnoses     Hyperlipidemia, unspecified hyperlipidemia type          Hypothryoidism-new.  Synthroid0.025mg  daily-SED and how to take.  Repeat TSH,FT3,FT4,cmp in 2 mo.   HLD-chronic.  Newer dx.  Pt working on diet/exercise, but worsening.  Neg FH.  Continue diet/exercise.  May poss be compounded by thyroid.  Will tx thyroid as well.    Meds ordered this encounter  Medications   levothyroxine (SYNTHROID) 25 MCG tablet    Sig: Take 1 tablet (25 mcg total) by mouth daily.    Dispense:  90 tablet    Refill:  [place], MD

## 2022-01-07 NOTE — Patient Instructions (Addendum)
It was very nice to see you today!  Take thyroid meds first am an empty stomach and glass of water and wait 1 hour before other meds/drinks/foods.   Try small protein snack before bed   PLEASE NOTE:  If you had any lab tests please let us know if you have not heard back within a few days. You may see your results on MyChart before we have a chance to review them but we will give you a call once they are reviewed by [place]. If we ordered any referrals today, please let us know if you have not heard from their office within the next week.   Please try these tips to maintain a healthy lifestyle:  Eat most of your calories during the day when you are active. Eliminate processed foods including packaged sweets (pies, cakes, cookies), reduce intake of potatoes, white bread, white pasta, and white rice. Look for whole grain options, oat flour or almond flour.  Each meal should contain half fruits/vegetables, one quarter protein, and one quarter carbs (no bigger than a computer mouse).  Cut down on sweet beverages. This includes juice, soda, and sweet tea. Also watch fruit intake, though this is a healthier sweet option, it still contains natural sugar! Limit to 3 servings daily.  Drink at least 1 glass of water with each meal and aim for at least 8 glasses per day  Exercise at least 150 minutes every week.

## 2022-01-10 ENCOUNTER — Encounter: Payer: Self-pay | Admitting: *Deleted

## 2022-01-13 DIAGNOSIS — L811 Chloasma: Secondary | ICD-10-CM | POA: Diagnosis not present

## 2022-02-09 DIAGNOSIS — G43909 Migraine, unspecified, not intractable, without status migrainosus: Secondary | ICD-10-CM | POA: Diagnosis not present

## 2022-02-09 DIAGNOSIS — H04123 Dry eye syndrome of bilateral lacrimal glands: Secondary | ICD-10-CM | POA: Diagnosis not present

## 2022-02-09 DIAGNOSIS — H25013 Cortical age-related cataract, bilateral: Secondary | ICD-10-CM | POA: Diagnosis not present

## 2022-02-09 DIAGNOSIS — G911 Obstructive hydrocephalus: Secondary | ICD-10-CM | POA: Diagnosis not present

## 2022-03-02 ENCOUNTER — Other Ambulatory Visit (INDEPENDENT_AMBULATORY_CARE_PROVIDER_SITE_OTHER): Payer: BC Managed Care – PPO

## 2022-03-02 DIAGNOSIS — E038 Other specified hypothyroidism: Secondary | ICD-10-CM

## 2022-03-02 LAB — COMPREHENSIVE METABOLIC PANEL
ALT: 17 U/L (ref 0–35)
AST: 15 U/L (ref 0–37)
Albumin: 4.1 g/dL (ref 3.5–5.2)
Alkaline Phosphatase: 43 U/L (ref 39–117)
BUN: 10 mg/dL (ref 6–23)
CO2: 30 mEq/L (ref 19–32)
Calcium: 8.9 mg/dL (ref 8.4–10.5)
Chloride: 101 mEq/L (ref 96–112)
Creatinine, Ser: 0.59 mg/dL (ref 0.40–1.20)
GFR: 111.04 mL/min (ref >60.00)
Glucose, Bld: 78 mg/dL (ref 70–99)
Potassium: 3.8 mEq/L (ref 3.5–5.1)
Sodium: 137 mEq/L (ref 135–145)
Total Bilirubin: 0.4 mg/dL (ref 0.2–1.2)
Total Protein: 6.9 g/dL (ref 6.0–8.3)

## 2022-03-02 LAB — TSH: TSH: 4.01 u[IU]/mL (ref 0.35–5.50)

## 2022-03-02 LAB — T3, FREE: T3, Free: 3.6 pg/mL (ref 2.3–4.2)

## 2022-03-02 LAB — T4, FREE: Free T4: 0.95 ng/dL (ref 0.60–1.60)

## 2022-03-02 NOTE — Progress Notes (Signed)
Labs ok.   Thyroid much better, but could still use increase if would like to increase- 0.05 mg.  #90-repeat tsh 2 months.   If wants to stay at the 25-can

## 2022-03-03 ENCOUNTER — Other Ambulatory Visit: Payer: Self-pay | Admitting: *Deleted

## 2022-03-03 DIAGNOSIS — R7989 Other specified abnormal findings of blood chemistry: Secondary | ICD-10-CM

## 2022-03-03 MED ORDER — LEVOTHYROXINE SODIUM 50 MCG PO TABS: 50.0000 ug | ORAL_TABLET | Freq: Every day | ORAL | 3 refills | Status: DC

## 2022-03-09 ENCOUNTER — Ambulatory Visit: Payer: BC Managed Care – PPO | Admitting: Family Medicine

## 2022-03-25 DIAGNOSIS — E039 Hypothyroidism, unspecified: Secondary | ICD-10-CM | POA: Diagnosis not present

## 2022-03-25 DIAGNOSIS — Z01411 Encounter for gynecological examination (general) (routine) with abnormal findings: Secondary | ICD-10-CM | POA: Diagnosis not present

## 2022-03-25 DIAGNOSIS — Z6825 Body mass index (BMI) 25.0-25.9, adult: Secondary | ICD-10-CM | POA: Diagnosis not present

## 2022-03-25 DIAGNOSIS — Z1231 Encounter for screening mammogram for malignant neoplasm of breast: Secondary | ICD-10-CM | POA: Diagnosis not present

## 2022-03-25 DIAGNOSIS — Z3040 Encounter for surveillance of contraceptives, unspecified: Secondary | ICD-10-CM | POA: Diagnosis not present

## 2022-03-31 ENCOUNTER — Encounter: Payer: Self-pay | Admitting: *Deleted

## 2022-04-21 ENCOUNTER — Telehealth: Payer: Self-pay | Admitting: Family Medicine

## 2022-04-21 NOTE — Telephone Encounter (Signed)
Patient notified of message below and verbalized understanding. Patient stated that she has an appointment schedule for tomorrow with Dr. Ethelene Hal.

## 2022-04-21 NOTE — Telephone Encounter (Signed)
Please se message below. 

## 2022-04-21 NOTE — Telephone Encounter (Signed)
Patient states: -Tested positive for covid on 12/27 after experiencing cough and congestion - Currently testing negative but coughing has worsened  - Coughing makes breathing more difficult-- she has hard time talking  Patient has been transferred to triage.

## 2022-04-22 ENCOUNTER — Encounter: Payer: Self-pay | Admitting: Family Medicine

## 2022-04-22 ENCOUNTER — Ambulatory Visit (INDEPENDENT_AMBULATORY_CARE_PROVIDER_SITE_OTHER): Payer: BC Managed Care – PPO | Admitting: Family Medicine

## 2022-04-22 VITALS — BP 118/72 | HR 62 | Temp 98.1°F | Ht 62.0 in | Wt 137.6 lb

## 2022-04-22 DIAGNOSIS — R058 Other specified cough: Secondary | ICD-10-CM | POA: Diagnosis not present

## 2022-04-22 MED ORDER — PREDNISONE 20 MG PO TABS: 20.0000 mg | ORAL_TABLET | Freq: Two times a day (BID) | ORAL | 0 refills | Status: AC

## 2022-04-22 MED ORDER — BENZONATATE 200 MG PO CAPS: 200.0000 mg | ORAL_CAPSULE | Freq: Two times a day (BID) | ORAL | 0 refills | Status: DC | PRN

## 2022-04-22 NOTE — Telephone Encounter (Signed)
Pt has an appt 04/22/22 with Dr. Ethelene Hal.   Patient Name: Deborah Hines Gender: Female DOB: [birth year] Age: 42 Y 70 M 14 D Return Phone Number: [phone] (Primary), [phone] (Secondary) Address: City/ State/ Zip: [place]  [address] Client [facility] Client Site [facility] Contact Type Call Who Is Calling Patient / Member / Family / Caregiver Call Type Triage / Clinical Relationship To Patient Self Return Phone Number [phone] (Primary) Chief Complaint BREATHING - shortness of breath or sounds breathless Reason for Call Symptomatic / Request for [place] states she tested positive for Covid on 12/27 and still has a worsening cough that is causing SOB. Translation No Nurse Assessment Nurse: Humfleet, RN, Estill Bamberg Date/Time (Eastern Time): 04/21/2022 9:55:06 AM Confirm and document reason for call. If symptomatic, describe symptoms. ---caller states she has had covid since dec 27th. still has symptoms. still has cough, shortness of breath. Does the patient have any new or worsening symptoms? ---Yes Will a triage be completed? ---Yes Related visit to physician within the last 2 weeks? ---No Does the PT have any chronic conditions? (i.e. diabetes, asthma, this includes High risk factors for pregnancy, etc.) ---Yes List chronic conditions. ---hydrocephalies with shunt Is the patient pregnant or possibly pregnant? (Ask all females between the ages of 33-55) ---No Is this a behavioral health or substance abuse call? ---No Guidelines Guideline Title Affirmed Question Affirmed Notes Nurse Date/Time (Eastern Time) COVID-19 - Diagnosed or Suspected [1] HIGH RISK patient (e.g., weak immune system, age > 75 years, obesity with BMI 30 or higher, pregnant, chronic lung disease Humfleet, RN, Estill Bamberg 04/21/2022 9:57:42 AM Guidelines Guideline Title Affirmed  Question Affirmed Notes Nurse Date/Time (Eastern Time) or other chronic medical condition) AND [2] COVID symptoms (e.g., cough, fever) (Exceptions: Already seen by PCP and no new or worsening symptoms.) Disp. Time Eilene Ghazi Time) Disposition Final User 04/21/2022 9:54:13 AM Send to Urgent Laural Golden 04/21/2022 10:00:04 AM Call PCP within 24 Hours Yes Humfleet, RN, Estill Bamberg Final Disposition 04/21/2022 10:00:04 AM Call PCP within 24 Hours Yes Humfleet, RN, Shelly Coss Disagree/Comply Comply Caller Understands Yes PreDisposition Did not know what to do Care Advice Given Per Guideline CALL PCP WITHIN 24 HOURS: * You need to discuss this with your doctor (or NP/PA) within the next 24 hours. * IF OFFICE WILL BE OPEN: Call the office when it opens tomorrow morning. CARE ADVICE given per COVID-19 - DIAGNOSED OR SUSPECTED (Adult) guideline. * You become worse CALL BACK IF: Comments User: Rozelle Logan, RN Date/Time Eilene Ghazi Time): 04/21/2022 9:56:50 AM symptoms on the 24th User: Rozelle Logan, RN Date/Time Eilene Ghazi Time): 04/21/2022 9:57:03 AM error : symptoms started on the 21st Referrals REFERRED TO PCP OFFICE

## 2022-04-22 NOTE — Progress Notes (Signed)
Established Patient Office Visit   Subjective:  Patient ID: Deborah Hines, female    DOB: 1981/01/08  Age: 42 y.o. MRN: [id number]  Chief Complaint  Patient presents with   Acute Visit    Tested (+) covid 12/26, (-)12/31. Persistent cough, no other symptoms, OTC meds not effective.    HPI Encounter Diagnoses  Name Primary?   Post-viral cough syndrome Yes   Status post URI symptoms over the last few weeks.  She is gradually improved but has been left with a persistent dry cough.  She denies wheezing or difficulty breathing.  She has no asthma or tobacco history.  There has been no fever or chills.  She had tested positive for COVID a week ago.   Review of Systems  Constitutional: Negative.   HENT: Negative.    Eyes:  Negative for blurred vision, discharge and redness.  Respiratory:  Positive for cough. Negative for sputum production, shortness of breath and wheezing.   Cardiovascular: Negative.   Gastrointestinal:  Negative for abdominal pain.  Genitourinary: Negative.   Musculoskeletal: Negative.  Negative for myalgias.  Skin:  Negative for rash.  Neurological:  Negative for tingling, loss of consciousness and weakness.  Endo/Heme/Allergies:  Negative for polydipsia.     Current Outpatient Medications:    benzonatate (TESSALON) 200 MG capsule, Take 1 capsule (200 mg total) by mouth 2 (two) times daily as needed for cough., Disp: 20 capsule, Rfl: 0   JUNEL 1/20 1-20 MG-MCG tablet, Take 1 tablet by mouth daily., Disp: , Rfl:    levothyroxine (SYNTHROID) 50 MCG tablet, Take 1 tablet (50 mcg total) by mouth daily., Disp: 90 tablet, Rfl: 3   predniSONE (DELTASONE) 20 MG tablet, Take 1 tablet (20 mg total) by mouth 2 (two) times daily with a meal for 7 days., Disp: 14 tablet, Rfl: 0   Objective:     BP 118/72 (BP Location: Left Arm, Patient Position: Sitting)   Pulse 62   Temp 98.1 F (36.7 C) (Oral)   Ht 5\' 2"  (1.575 m)   Wt 137 lb 9.6 oz (62.4 kg)   LMP 03/30/2022  (Approximate)   SpO2 99%   Breastfeeding No   BMI 25.17 kg/m    Physical Exam Constitutional:      General: She is not in acute distress.    Appearance: Normal appearance. She is not ill-appearing, toxic-appearing or diaphoretic.  HENT:     Head: Normocephalic and atraumatic.     Right Ear: External ear normal.     Left Ear: External ear normal.  Eyes:     General: No scleral icterus.       Right eye: No discharge.        Left eye: No discharge.     Extraocular Movements: Extraocular movements intact.     Conjunctiva/sclera: Conjunctivae normal.  Cardiovascular:     Rate and Rhythm: Normal rate and regular rhythm.  Pulmonary:     Effort: Pulmonary effort is normal. No respiratory distress.     Breath sounds: Normal breath sounds. No wheezing or rales.  Skin:    General: Skin is warm and dry.  Neurological:     Mental Status: She is alert and oriented to person, place, and time.  Psychiatric:        Mood and Affect: Mood normal.        Behavior: Behavior normal.      No results found for any visits on 04/22/22.    The 10-year ASCVD risk score (Arnett  DK, et al., 2019) is: 0.9%    Assessment & Plan:   Post-viral cough syndrome -     predniSONE; Take 1 tablet (20 mg total) by mouth 2 (two) times daily with a meal for 7 days.  Dispense: 14 tablet; Refill: 0 -     Benzonatate; Take 1 capsule (200 mg total) by mouth 2 (two) times daily as needed for cough.  Dispense: 20 capsule; Refill: 0    Return Follow-up with primary provider if not improving in a week.Libby Maw, MD

## 2022-04-22 NOTE — Telephone Encounter (Signed)
Noted  

## 2022-05-03 ENCOUNTER — Other Ambulatory Visit: Payer: Self-pay | Admitting: *Deleted

## 2022-05-03 ENCOUNTER — Other Ambulatory Visit (INDEPENDENT_AMBULATORY_CARE_PROVIDER_SITE_OTHER): Payer: BC Managed Care – PPO

## 2022-05-03 DIAGNOSIS — R7989 Other specified abnormal findings of blood chemistry: Secondary | ICD-10-CM

## 2022-05-03 DIAGNOSIS — E038 Other specified hypothyroidism: Secondary | ICD-10-CM

## 2022-05-03 LAB — TSH: TSH: 4.19 u[IU]/mL (ref 0.35–5.50)

## 2022-05-03 MED ORDER — LEVOTHYROXINE SODIUM 75 MCG PO TABS: 75.0000 ug | ORAL_TABLET | Freq: Every day | ORAL | 0 refills | Status: DC

## 2022-05-03 NOTE — Progress Notes (Signed)
Tsh same.  Verify taking daily and not missing doses.  If so, can increase to 0.075 and repeat in 2 mo, or continue as is

## 2022-05-19 DIAGNOSIS — R928 Other abnormal and inconclusive findings on diagnostic imaging of breast: Secondary | ICD-10-CM | POA: Diagnosis not present

## 2022-05-24 DIAGNOSIS — R92333 Mammographic heterogeneous density, bilateral breasts: Secondary | ICD-10-CM | POA: Diagnosis not present

## 2022-05-24 DIAGNOSIS — R928 Other abnormal and inconclusive findings on diagnostic imaging of breast: Secondary | ICD-10-CM | POA: Diagnosis not present

## 2022-06-01 ENCOUNTER — Encounter: Payer: Self-pay | Admitting: Family Medicine

## 2022-06-30 ENCOUNTER — Encounter: Payer: BC Managed Care – PPO | Admitting: Family Medicine

## 2022-07-01 ENCOUNTER — Encounter: Payer: BC Managed Care – PPO | Admitting: Family Medicine

## 2022-08-01 ENCOUNTER — Other Ambulatory Visit: Payer: Self-pay | Admitting: Family Medicine

## 2022-08-02 ENCOUNTER — Encounter: Payer: Self-pay | Admitting: Family Medicine

## 2022-08-02 ENCOUNTER — Other Ambulatory Visit: Payer: Self-pay | Admitting: Family Medicine

## 2022-08-02 ENCOUNTER — Ambulatory Visit (INDEPENDENT_AMBULATORY_CARE_PROVIDER_SITE_OTHER): Payer: BC Managed Care – PPO | Admitting: Family Medicine

## 2022-08-02 VITALS — BP 104/70 | HR 60 | Temp 98.1°F | Ht 62.0 in | Wt 140.1 lb

## 2022-08-02 DIAGNOSIS — Z0000 Encounter for general adult medical examination without abnormal findings: Secondary | ICD-10-CM

## 2022-08-02 DIAGNOSIS — E038 Other specified hypothyroidism: Secondary | ICD-10-CM

## 2022-08-02 LAB — CBC WITH DIFFERENTIAL/PLATELET
Basophils Absolute: 0 10*3/uL (ref 0.0–0.1)
Basophils Relative: 0.6 % (ref 0.0–3.0)
Eosinophils Absolute: 0.3 10*3/uL (ref 0.0–0.7)
Eosinophils Relative: 4.1 % (ref 0.0–5.0)
HCT: 39.8 % (ref 36.0–46.0)
Hemoglobin: 13.4 g/dL (ref 12.0–15.0)
Lymphocytes Relative: 39.9 % (ref 12.0–46.0)
Lymphs Abs: 2.5 10*3/uL (ref 0.7–4.0)
MCHC: 33.5 g/dL (ref 30.0–36.0)
MCV: 90.2 fl (ref 78.0–100.0)
Monocytes Absolute: 0.5 10*3/uL (ref 0.1–1.0)
Monocytes Relative: 7.8 % (ref 3.0–12.0)
Neutro Abs: 2.9 10*3/uL (ref 1.4–7.7)
Neutrophils Relative %: 47.6 % (ref 43.0–77.0)
Platelets: 208 10*3/uL (ref 150.0–400.0)
RBC: 4.42 Mil/uL (ref 3.87–5.11)
RDW: 12.4 % (ref 11.5–15.5)
WBC: 6.2 10*3/uL (ref 4.0–10.5)

## 2022-08-02 LAB — COMPREHENSIVE METABOLIC PANEL
ALT: 25 U/L (ref 0–35)
AST: 15 U/L (ref 0–37)
Albumin: 4.2 g/dL (ref 3.5–5.2)
Alkaline Phosphatase: 41 U/L (ref 39–117)
BUN: 11 mg/dL (ref 6–23)
CO2: 27 mEq/L (ref 19–32)
Calcium: 9 mg/dL (ref 8.4–10.5)
Chloride: 103 mEq/L (ref 96–112)
Creatinine, Ser: 0.56 mg/dL (ref 0.40–1.20)
GFR: 112.12 mL/min (ref >60.00)
Glucose, Bld: 76 mg/dL (ref 70–99)
Potassium: 3.7 mEq/L (ref 3.5–5.1)
Sodium: 138 mEq/L (ref 135–145)
Total Bilirubin: 0.5 mg/dL (ref 0.2–1.2)
Total Protein: 6.9 g/dL (ref 6.0–8.3)

## 2022-08-02 LAB — HEMOGLOBIN A1C: Hgb A1c MFr Bld: 5.3 % (ref 4.6–6.5)

## 2022-08-02 LAB — LIPID PANEL
Cholesterol: 214 mg/dL — ABNORMAL HIGH (ref 0–200)
HDL: 50.6 mg/dL (ref >39.00)
LDL Cholesterol: 140 mg/dL — ABNORMAL HIGH (ref 0–99)
NonHDL: 163.61
Total CHOL/HDL Ratio: 4
Triglycerides: 120 mg/dL (ref 0.0–149.0)
VLDL: 24 mg/dL (ref 0.0–40.0)

## 2022-08-02 LAB — TSH: TSH: 0.62 u[IU]/mL (ref 0.35–5.50)

## 2022-08-02 MED ORDER — LEVOTHYROXINE SODIUM 75 MCG PO TABS: 75.0000 ug | ORAL_TABLET | Freq: Every day | ORAL | 1 refills | Status: DC

## 2022-08-02 NOTE — Patient Instructions (Signed)

## 2022-08-02 NOTE — Progress Notes (Signed)
1.  Cholesterol has improved from last year, however still needs some work.  Continue working on diet/exercise 2.  Thyroid may be a little overcorrected.  I sent in the same dose 3.  Rest the labs look great

## 2022-08-02 NOTE — Progress Notes (Signed)
Phone [phone]   Subjective:   Patient is a 42 y.o. female presenting for annual physical.    Chief Complaint  Patient presents with   Annual Exam    CPE Fasting for labs   Annual-feels hungry a lot.not exercising in Jan(go COVID).  Pap at gynecology due this year. Hypothyroidism-on synthroid 75.  Doing well. Taking correctly  See problem oriented charting- ROS- ROS: Gen: no fever, chills  Skin: no rash, itching ENT: no ear pain, ear drainage, nasal congestion, rhinorrhea, sinus pressure, sore throat Eyes: no blurry vision, double vision Resp: no cough, wheeze,SOB CV: no CP, palpitations, LE edema,  GI: no heartburn, n/v/d/c, abd pain.  Occasional heartburn GU: no dysuria, urgency, frequency, hematuria MSK: no joint pain, myalgias, back pain Neuro: no dizziness, headache, weakness, vertigo Psych: no depression, anxiety, insomnia, SI   No FH CAD/CVA  The following were reviewed and entered/updated in epic: Past Medical History:  Diagnosis Date   Allergy    Hydrocephalus    Medical history non-contributory    Pregnant    Patient Active Problem List   Diagnosis Date Noted   Other specified hypothyroidism 01/07/2022   NSVD (normal spontaneous vaginal delivery) 11/11/2013   Past Surgical History:  Procedure Laterality Date   BRAIN SURGERY     shunt placed in May 2021   LASIK Right 2017   NO PAST SURGERIES      Family History  Problem Relation Age of Onset   Diabetes Mother    Asthma Sister    Asthma Sister    Hypertension Maternal Grandmother    Diabetes Maternal Grandmother    Hypertension Maternal Grandfather     Medications- reviewed and updated Current Outpatient Medications  Medication Sig Dispense Refill   JUNEL 1/20 1-20 MG-MCG tablet Take 1 tablet by mouth daily.     levothyroxine (SYNTHROID) 75 MCG tablet TAKE 1 TABLET BY MOUTH DAILY 30 tablet 0   No current facility-administered medications for this visit.    Allergies-reviewed and  updated Allergies  Allergen Reactions   Montelukast Other (See Comments)   Naproxen Sodium     Other reaction(s): Dizziness (intolerance)  Other Reaction(s): Dizziness   Nsaids    Zonisamide     Other Reaction(s): migraine   Naproxen Other (See Comments)    Dizziness  Other reaction(s): dizziness  Other reaction(s): Dizziness, Other  Other Reaction(s): dizzy, can't open eyes    Social History   Social History Narrative   Right handed   Two story home   homemaker   Objective  Objective:  BP 104/70   Pulse 60   Temp 98.1 F (36.7 C) (Temporal)   Ht  (1.575 m)   Wt 140 lb 2 oz (63.6 kg)   LMP 07/08/2022 (Exact Date)   SpO2 99%   BMI 25.63 kg/m  Physical Exam  Gen: WDWN NAD HEENT: NCAT, conjunctiva not injected, sclera nonicteric TM WNL B, OP moist, no exudates  NECK:  supple, no thyromegaly, no nodes, no carotid bruits CARDIAC: RRR, S1S2+, no murmur. DP 2+B LUNGS: CTAB. No wheezes ABDOMEN:  BS+, soft, NTND, No HSM, no masses EXT:  no edema MSK: no gross abnormalities. MS 5/5 all 4 NEURO: A&O x3.  CN II-XII intact.  PSYCH: normal mood. Good eye contact Corn right foot near distal end 5th metatarsal      Assessment and Plan   Health Maintenance counseling: 1. Anticipatory guidance: Patient counseled regarding regular dental exams q6 months, eye exams,  avoiding smoking and  second hand smoke, limiting alcohol to 1 beverage per day, no illicit drugs.   2. Risk factor reduction:  Advised patient of need for regular exercise and diet rich and fruits and vegetables to reduce risk of heart attack and stroke. Exercise- encouraged.  Wt Readings from Last 3 Encounters:  08/02/22 140 lb 2 oz (63.6 kg)  04/22/22 137 lb 9.6 oz (62.4 kg)  01/07/22 136 lb (61.7 kg)   3. Immunizations/screenings/ancillary studies Immunization History  Administered Date(s) Administered   Meningococcal Conjugate 08/17/2017   PFIZER(Purple Top)SARS-COV-2 Vaccination 06/27/2019,  07/18/2019, 03/26/2020   Tdap 09/11/2013   Health Maintenance Due  Topic Date Due   PAP SMEAR-Modifier  07/19/2022    4. Cervical cancer screening- gyn 5. Breast cancer screening-  mammogram UTD 6. Colon cancer screening - n/a 7. Skin cancer screening- advised regular sunscreen use. Denies worrisome, changing, or new skin lesions.  8. Birth control/STD check- on oral contraceptive pill(s) for cycles 9. Osteoporosis screening- n/a 10. Smoking associated screening - non smoker  Problem List Items Addressed This Visit       Endocrine   Other specified hypothyroidism   Relevant Orders   TSH   Other Visit Diagnoses     Wellness examination    -  Primary   Relevant Orders   Lipid panel   Comprehensive metabolic panel   CBC with Differential/Platelet   Hemoglobin A1c   TSH     Wellness-anticipatory guidance.  Work on Diet/Exercise  Check CBC,CMP,lipids,TSH, A1C.  F/u 1 yr  Hypothyroidism-chronic.  Not ideal.  Increased to 75 2 month(s) ago, will check TSH and adjust accordingly.  Follow up 55m  Recommended follow up: in about 6 months (around 02/01/2023) for thyroid. No future appointments.  Lab/Order associations:+ fasting   ICD-10-CM   1. Wellness examination  Z00.00 Lipid panel    Comprehensive metabolic panel    CBC with Differential/Platelet    Hemoglobin A1c    TSH    2. Other specified hypothyroidism  E03.8 TSH      No orders of the defined types were placed in this encounter.   Angelena Sole, MD

## 2022-11-25 DIAGNOSIS — R928 Other abnormal and inconclusive findings on diagnostic imaging of breast: Secondary | ICD-10-CM | POA: Diagnosis not present

## 2022-11-25 DIAGNOSIS — N6001 Solitary cyst of right breast: Secondary | ICD-10-CM | POA: Diagnosis not present

## 2022-11-25 DIAGNOSIS — R92333 Mammographic heterogeneous density, bilateral breasts: Secondary | ICD-10-CM | POA: Diagnosis not present

## 2023-01-31 NOTE — Progress Notes (Signed)
  Subjective:    Patient ID: Deborah Hines, female    DOB: Jan 18, 1981, 42 y.o.   MRN: [id number]  No chief complaint on file.   HPI Hpyothyroidism - Managed with Synthroid .   *** - ***  *** - ***  *** - ***  *** - ***  *** - ***  Health Maintenance Due  Topic Date Due   Cervical Cancer Screening (HPV/Pap Cotest)  07/18/2020   INFLUENZA VACCINE  Never done   COVID-19 Vaccine (4 - 2023-24 season) 12/18/2022   Past Medical History:  Diagnosis Date   Allergy    Hydrocephalus [facility])    Medical history non-contributory    Pregnant    Past Surgical History:  Procedure Laterality Date   BRAIN SURGERY     shunt placed in May 2021   LASIK Right 2017   NO PAST SURGERIES     Current Outpatient Medications:    JUNEL 1/20 1-20 MG-MCG tablet, Take 1 tablet by mouth daily., Disp: , Rfl:    levothyroxine (SYNTHROID) 75 MCG tablet, Take 1 tablet (75 mcg total) by mouth daily., Disp: 90 tablet, Rfl: 1  Allergies  Allergen Reactions   Montelukast Other (See Comments)   Naproxen Sodium     Other reaction(s): Dizziness (intolerance)  Other Reaction(s): Dizziness   Nsaids    Zonisamide     Other Reaction(s): migraine   Naproxen Other (See Comments)    Dizziness  Other reaction(s): dizziness  Other reaction(s): Dizziness, Other  Other Reaction(s): dizzy, can't open eyes   ROS neg/noncontributory except as noted HPI/below    Objective:    There were no vitals taken for this visit. Wt Readings from Last 3 Encounters:  08/02/22 140 lb 2 oz (63.6 kg)  04/22/22 137 lb 9.6 oz (62.4 kg)  01/07/22 136 lb (61.7 kg)   Physical Exam   Gen: WDWN NAD HEENT: NCAT, conjunctiva not injected, sclera nonicteric NECK:  supple, no thyromegaly, no nodes, no carotid bruits CARDIAC: RRR, S1S2+, no murmur. DP 2+B LUNGS: CTAB. No wheezes ABDOMEN:  BS+, soft, NTND, No HSM, no masses EXT:  no edema MSK: no gross abnormalities.  NEURO: A&O x3.  CN II-XII intact.  PSYCH: normal  mood. Good eye contact    Assessment & Plan:  There are no diagnoses linked to this encounter.  No follow-ups on file.         I,Emily Lagle,acting as a Neurosurgeon for Angelena Sole, MD.,have documented all relevant documentation on the behalf of Angelena Sole, MD,as directed by  Angelena Sole, MD while in the presence of Angelena Sole, MD.  I, Angelena Sole, MD, have reviewed all documentation for this visit. The documentation on 01/31/23 for the exam, diagnosis, procedures, and orders are all accurate and complete. *** (refresh reminder)  Larey Brick

## 2023-02-01 ENCOUNTER — Ambulatory Visit: Payer: BC Managed Care – PPO | Admitting: Family Medicine

## 2023-02-01 ENCOUNTER — Encounter: Payer: Self-pay | Admitting: Family Medicine

## 2023-02-01 VITALS — BP 102/70 | HR 52 | Temp 98.0°F | Resp 16 | Ht 62.0 in | Wt 138.5 lb

## 2023-02-01 DIAGNOSIS — E038 Other specified hypothyroidism: Secondary | ICD-10-CM | POA: Diagnosis not present

## 2023-02-01 DIAGNOSIS — R928 Other abnormal and inconclusive findings on diagnostic imaging of breast: Secondary | ICD-10-CM

## 2023-02-01 DIAGNOSIS — E7800 Pure hypercholesterolemia, unspecified: Secondary | ICD-10-CM

## 2023-02-01 LAB — TSH: TSH: 2.05 u[IU]/mL (ref 0.35–5.50)

## 2023-02-01 LAB — LIPID PANEL
Cholesterol: 218 mg/dL — ABNORMAL HIGH (ref 0–200)
HDL: 46.4 mg/dL (ref >39.00)
LDL Cholesterol: 142 mg/dL — ABNORMAL HIGH (ref 0–99)
NonHDL: 171.95
Total CHOL/HDL Ratio: 5
Triglycerides: 152 mg/dL — ABNORMAL HIGH (ref 0.0–149.0)
VLDL: 30.4 mg/dL (ref 0.0–40.0)

## 2023-02-01 LAB — T3, FREE: T3, Free: 3.1 pg/mL (ref 2.3–4.2)

## 2023-02-01 LAB — T4, FREE: Free T4: 1.01 ng/dL (ref 0.60–1.60)

## 2023-02-01 NOTE — Assessment & Plan Note (Signed)
Newer dx/  stable.  Getting f/u mamm in Dec

## 2023-02-01 NOTE — Assessment & Plan Note (Signed)
Chronic.  LDL >130.  Working on diet/exercise

## 2023-02-01 NOTE — Assessment & Plan Note (Signed)
Chronic.  Controlled.  Continue synthroid 0.075mg 

## 2023-02-01 NOTE — Patient Instructions (Signed)

## 2023-02-02 ENCOUNTER — Other Ambulatory Visit: Payer: Self-pay | Admitting: *Deleted

## 2023-02-02 MED ORDER — LEVOTHYROXINE SODIUM 75 MCG PO TABS: 75.0000 ug | ORAL_TABLET | Freq: Every day | ORAL | 1 refills | Status: AC

## 2023-02-02 NOTE — Progress Notes (Signed)
1.Your cholesterol levels are elevated.  Work on low cholesterol and lower carbs/sugars diet and  get exercise to try to lower your cholesterol.  2.  Same dose of thyroid-please send #90/1 to pharm

## 2023-02-13 DIAGNOSIS — H25013 Cortical age-related cataract, bilateral: Secondary | ICD-10-CM | POA: Diagnosis not present

## 2023-02-13 DIAGNOSIS — G911 Obstructive hydrocephalus: Secondary | ICD-10-CM | POA: Diagnosis not present

## 2023-02-13 DIAGNOSIS — H04123 Dry eye syndrome of bilateral lacrimal glands: Secondary | ICD-10-CM | POA: Diagnosis not present

## 2023-02-13 DIAGNOSIS — H1045 Other chronic allergic conjunctivitis: Secondary | ICD-10-CM | POA: Diagnosis not present

## 2023-02-14 ENCOUNTER — Ambulatory Visit: Payer: BC Managed Care – PPO | Admitting: Family Medicine

## 2023-08-03 ENCOUNTER — Encounter: Payer: BC Managed Care – PPO | Admitting: Family Medicine
